# Patient Record
Sex: Female | Born: 1965
Health system: Southern US, Community
[De-identification: ages and names within clinical notes are randomized; demographics above are authoritative.]

## PROBLEM LIST (undated history)

## (undated) DIAGNOSIS — K219 Gastro-esophageal reflux disease without esophagitis: Secondary | ICD-10-CM

## (undated) DIAGNOSIS — E039 Hypothyroidism, unspecified: Secondary | ICD-10-CM

## (undated) DIAGNOSIS — E7800 Pure hypercholesterolemia, unspecified: Secondary | ICD-10-CM

## (undated) DIAGNOSIS — M1990 Unspecified osteoarthritis, unspecified site: Secondary | ICD-10-CM

## (undated) HISTORY — PX: REFRACTIVE SURGERY: SHX103

## (undated) HISTORY — PX: EYE SURGERY: SHX253

## (undated) HISTORY — DX: Unspecified osteoarthritis, unspecified site: M19.90

---

## 2004-08-06 ENCOUNTER — Observation Stay: Payer: Self-pay

## 2004-08-07 ENCOUNTER — Ambulatory Visit: Payer: Self-pay | Admitting: Obstetrics and Gynecology

## 2004-08-26 ENCOUNTER — Observation Stay: Payer: Self-pay | Admitting: Obstetrics and Gynecology

## 2004-08-27 ENCOUNTER — Inpatient Hospital Stay: Payer: Self-pay | Admitting: Obstetrics and Gynecology

## 2005-02-15 ENCOUNTER — Ambulatory Visit: Payer: Self-pay

## 2005-02-28 ENCOUNTER — Emergency Department: Payer: Self-pay | Admitting: Emergency Medicine

## 2005-05-21 ENCOUNTER — Ambulatory Visit (HOSPITAL_COMMUNITY): Admission: RE | Admit: 2005-05-21 | Discharge: 2005-05-21 | Payer: Self-pay | Admitting: *Deleted

## 2005-06-10 ENCOUNTER — Ambulatory Visit: Admission: RE | Admit: 2005-06-10 | Discharge: 2005-06-10 | Payer: Self-pay | Admitting: *Deleted

## 2005-06-28 ENCOUNTER — Observation Stay (HOSPITAL_COMMUNITY): Admission: EM | Admit: 2005-06-28 | Discharge: 2005-06-29 | Payer: Self-pay | Admitting: Emergency Medicine

## 2005-11-30 ENCOUNTER — Encounter: Admission: RE | Admit: 2005-11-30 | Discharge: 2005-11-30 | Payer: Self-pay | Admitting: Cardiology

## 2006-02-08 ENCOUNTER — Ambulatory Visit: Payer: Self-pay | Admitting: Obstetrics and Gynecology

## 2006-08-23 IMAGING — CT CT ANGIO CHEST
2 of 5 series · 19 of 36 positions shown · IV contrast (120 ML OMNI 300)
Comparison: none

HISTORY: Chest pain, elevated d-dimer

[Series 3: pe · axial · 0.70mm/px · z∈[-283,-26]mm · 16 of 457 slices shown]
[im 23/457  lung]
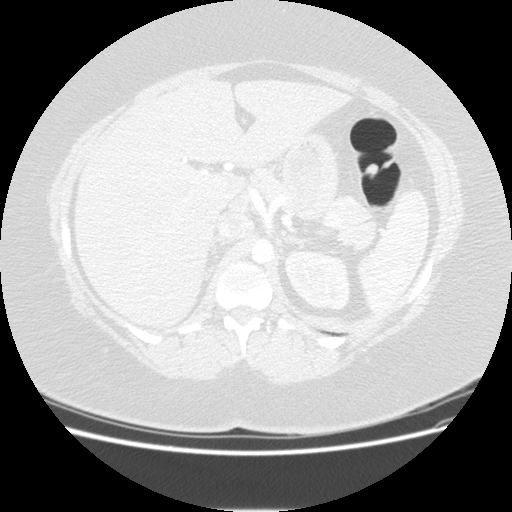
[im 46/457  mediastinal]
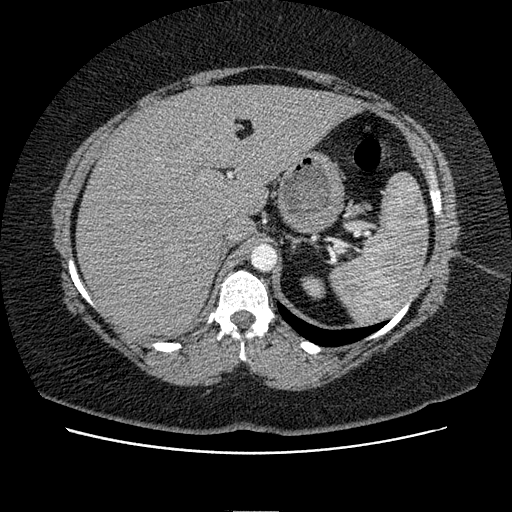
[im 69/457  lung]
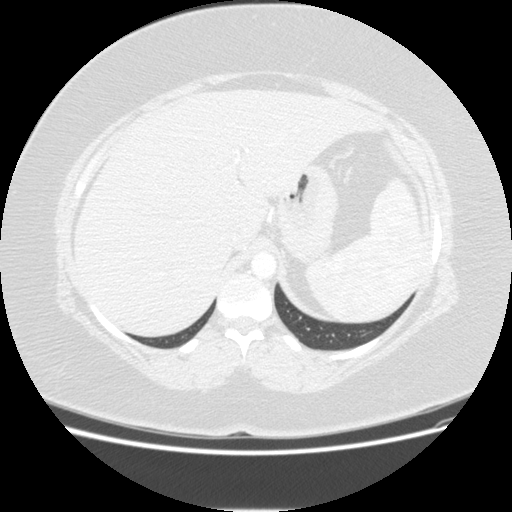
[im 115/457  mediastinal]
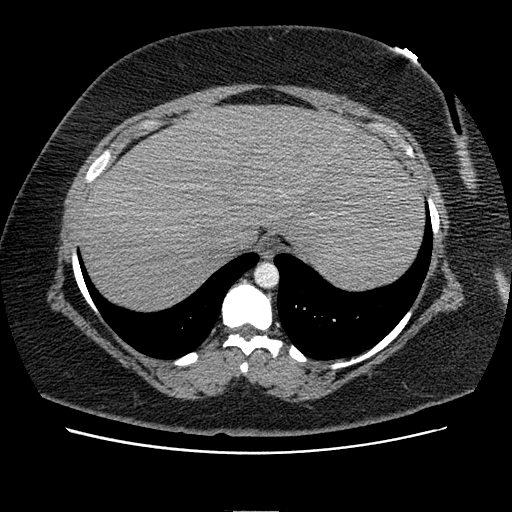
[im 137/457  lung]
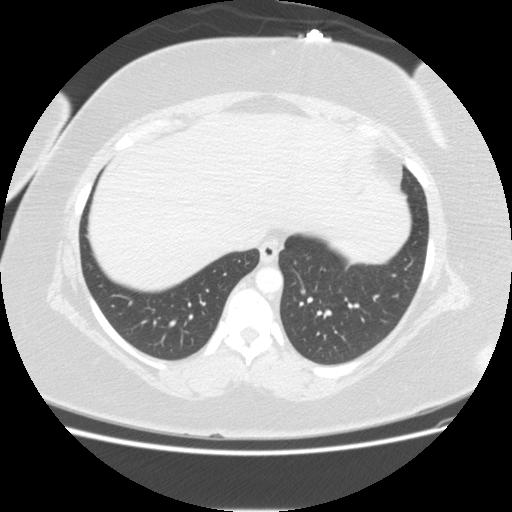
[im 160/457  mediastinal]
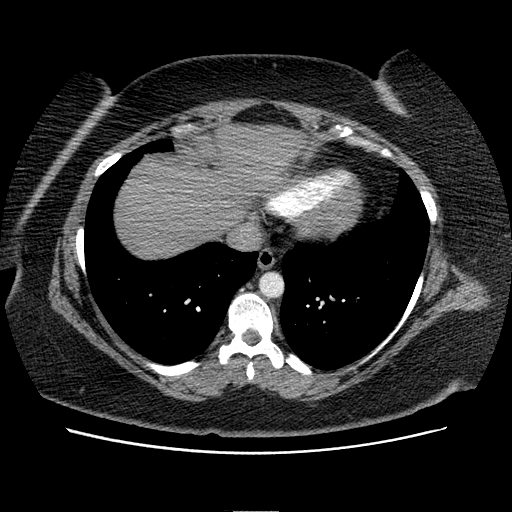
[im 183/457  lung]
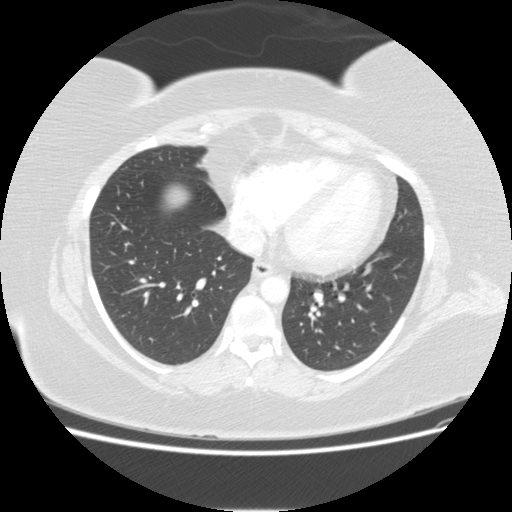
[im 206/457  mediastinal]
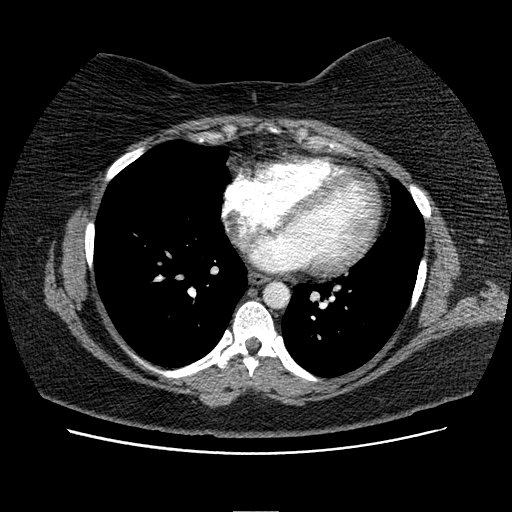
[im 251/457  lung]
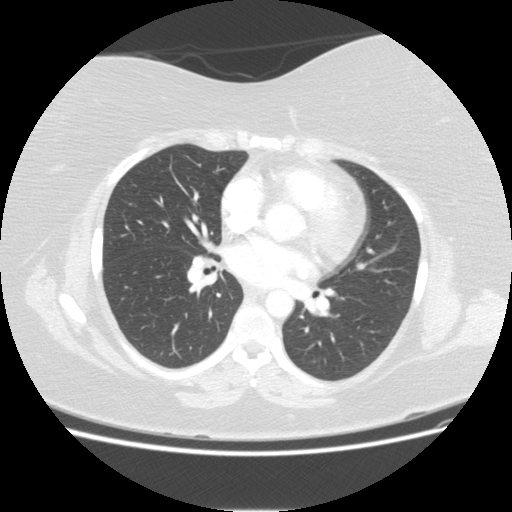
[im 274/457  mediastinal]
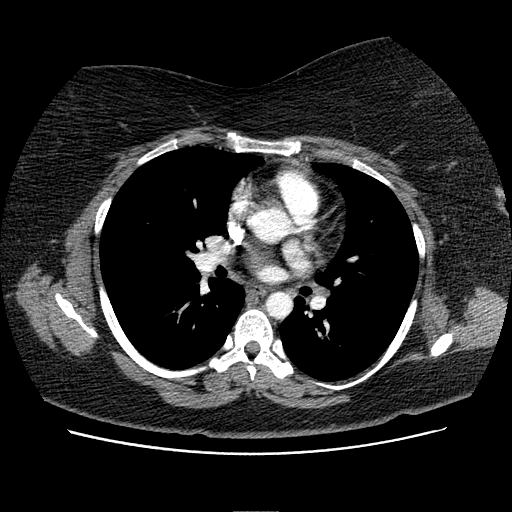
[im 297/457  lung]
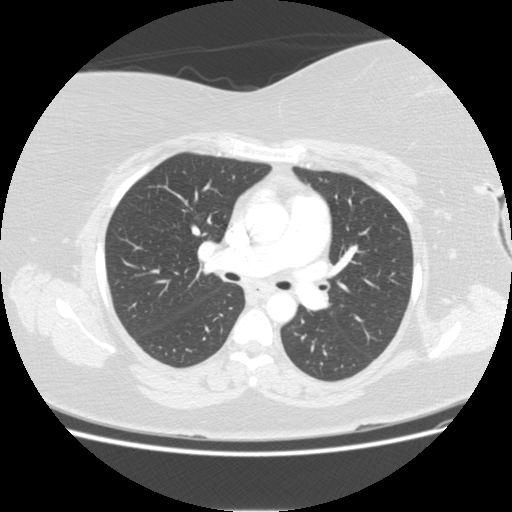
[im 320/457  mediastinal]
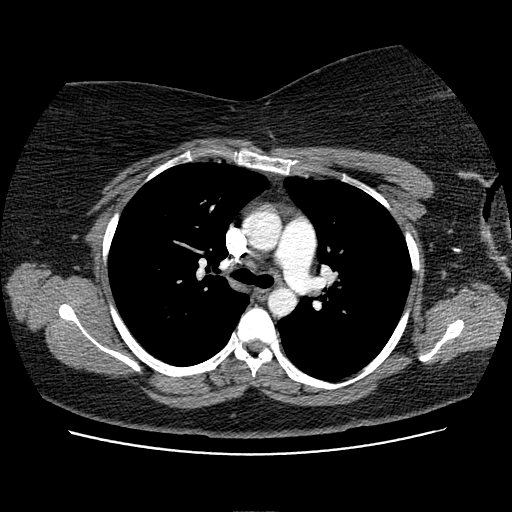
[im 343/457  lung]
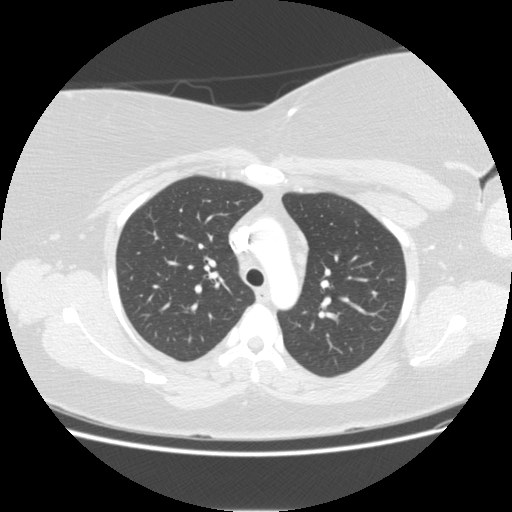
[im 388/457  mediastinal]
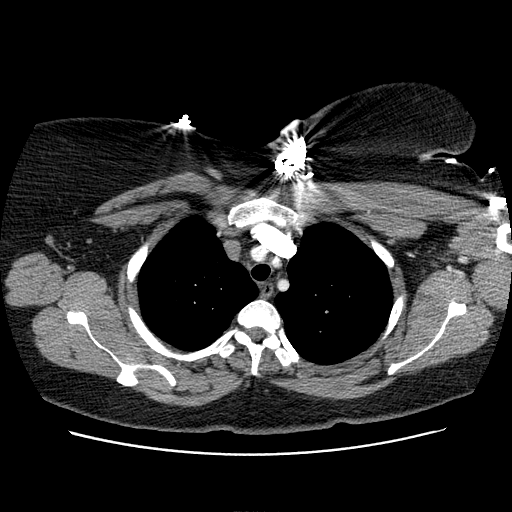
[im 411/457  lung]
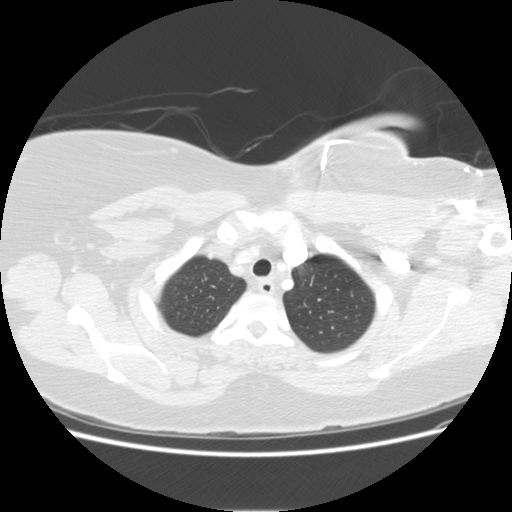
[im 434/457  mediastinal]
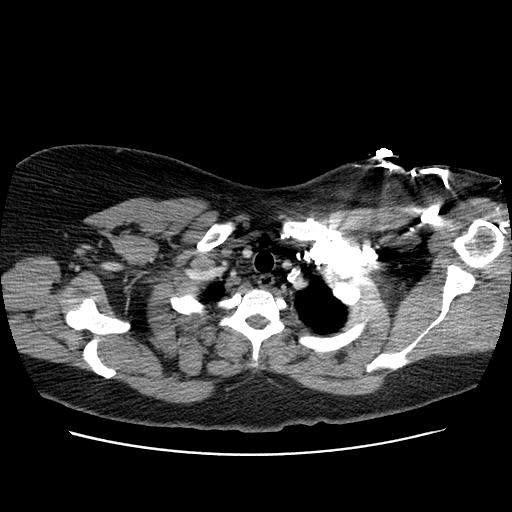

[Series 300: reformatted · coronal · 0.70mm/px · 3 of 117 slices shown]
[im 24/117  mediastinal]
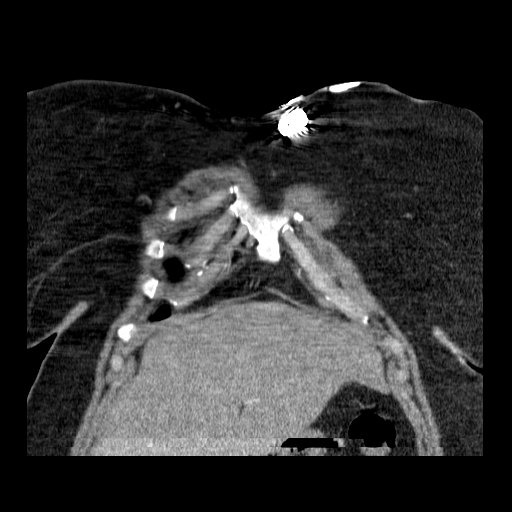
[im 47/117  mediastinal]
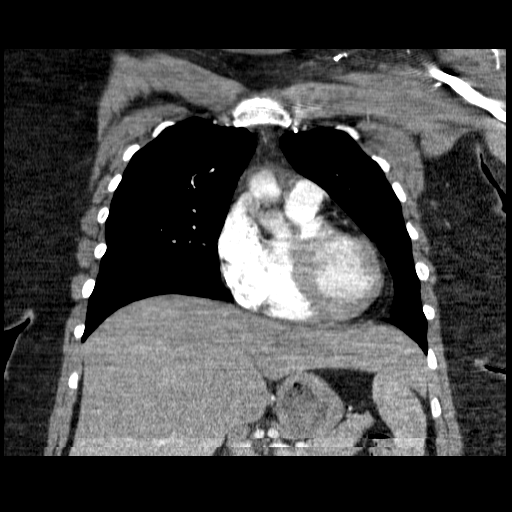
[im 70/117  mediastinal]
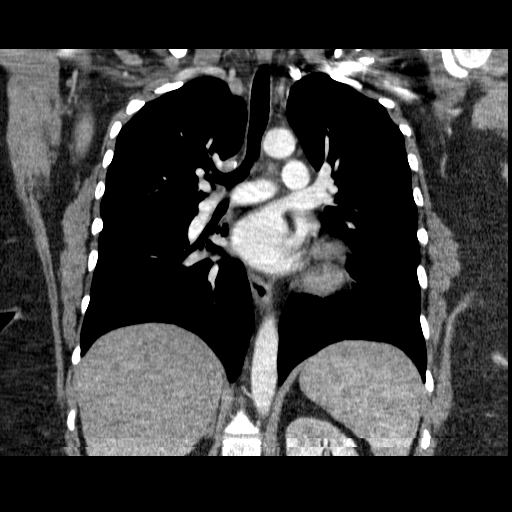

[19 of 36 positions shown; findings below may reference images not displayed]

CT ANGIO CHEST:

Multidetector helical CT imaging chest performed using pulmonary embolism
protocol following 120 cc 6mnipaque-POO. Additional coronal and sagittal images
are reconstructed from the axial data set to assist in evaluation of the
thoracic vasculature.
Comparison 06/10/2005

Aorta normal caliber without aneurysm or dissection.
Pulmonary arteries patent.
No evidence of pulmonary embolism.
Heart size normal. 
No enlarged thoracic lymph nodes.
4 mm diameter right lower lobe lung nodule, image 50, stable.
No pulmonary infiltrate, pleural effusion, or pneumothorax.
Visualized portion of upper abdomen unremarkable.
Electronic device subcutaneously in left anterior chest wall, new.
No bone lesions.
IMPRESSION: No evidence of pulmonary embolism or other acute thoracic process.
Persistent 4 mm diameter right lower lobe nodule.
Followup CT chest recommended in one year to confirm stability.

## 2007-02-22 ENCOUNTER — Ambulatory Visit (HOSPITAL_COMMUNITY): Admission: RE | Admit: 2007-02-22 | Discharge: 2007-02-22 | Payer: Self-pay | Admitting: *Deleted

## 2007-06-22 ENCOUNTER — Ambulatory Visit (HOSPITAL_COMMUNITY): Admission: RE | Admit: 2007-06-22 | Discharge: 2007-06-22 | Payer: Self-pay | Admitting: *Deleted

## 2008-02-28 ENCOUNTER — Ambulatory Visit: Payer: Self-pay | Admitting: Obstetrics and Gynecology

## 2008-04-13 ENCOUNTER — Emergency Department: Payer: Self-pay | Admitting: Emergency Medicine

## 2008-10-02 ENCOUNTER — Emergency Department: Payer: Self-pay | Admitting: Emergency Medicine

## 2009-03-04 ENCOUNTER — Ambulatory Visit: Payer: Self-pay | Admitting: Obstetrics and Gynecology

## 2010-04-16 ENCOUNTER — Ambulatory Visit: Payer: Self-pay | Admitting: Obstetrics and Gynecology

## 2010-09-22 NOTE — Op Note (Signed)
NAME:  Laura Gilmore, Laura Gilmore NO.:  [id number]   MEDICAL RECORD NO.:  [id number]          PATIENT TYPE:  OIB   LOCATION:  2859                         FACILITY:  [facility]   PHYSICIAN:  Darlin Priestly, MD  DATE OF BIRTH:  01-Sep-1965   DATE OF PROCEDURE:  06/22/2007  DATE OF DISCHARGE:  06/22/2007                               OPERATIVE REPORT   PROCEDURE:  Explant of a Medtronic Reveal Plus loop recorder, serial  #[id number].  Initial implant of June 10, 2005.   ATTENDING:  Darlin Priestly, M.D.   COMPLICATIONS:  None.   INDICATIONS:  Laura Gilmore is a 45 year old female with a history of  syncope and presyncope who underwent loop implant on June 10, 2005,  which revealed intermittent episodes of SVT, which have now resolved  with Toprol therapy.  She has recently been found to have a loop which  has now reached the end of life.  She is now brought for loop explant.   DESCRIPTION OF OPERATION:  After giving informed consent, the patient  was brought to the coronary cath lab where her left chest was prepped  and draped in a sterile fashion.  ECG monitor was established.  Lidocaine 1% was used to anesthetized over the previous implanted  generator.  Next, approximately 2 cm horizontal incision was then  carried out over the loop, and hemostasis was obtained with  electrocautery.  Blunt dissection was used to carry this down to the  loop capsule.  The capsule was then opened with a scalpel, and the  generator was then freed from the capsule.  Two anchoring silk sutures  were then removed from the header, and the loop recorder was then  removed from the pocket.  The sutures were then removed from the  pectoral fascia.  The pocket was then copiously irrigated with 1%  kanamycin solution.  Again, hemostasis was confirmed.  The subcutaneous  layers were then closed using 2-0 Vicryl.  The skin was then closed  using 4-0 Vicryl.  Steri-Strips were applied.  The  patient was taken to  the recovery room in stable condition.   CONCLUSION:  Successful implant of a Medtronic Reveal Plus loop  recorder, serial [id number], initially implanted on June 10, 2005.      Darlin Priestly, MD  Electronically Signed     RHM/MEDQ  D:  06/22/2007  T:  06/23/2007  Job:  [id number]

## 2010-09-25 NOTE — Discharge Summary (Signed)
NAME:  Laura Gilmore, FALES NO.:  [id number]   MEDICAL RECORD NO.:  [id number]          PATIENT TYPE:  INP   LOCATION:  3735                         FACILITY:  [facility]   PHYSICIAN:  Darlin Priestly, MD  DATE OF BIRTH:  [birth year]   DATE OF ADMISSION:  06/28/2005  DATE OF DISCHARGE:  06/29/2005                                 DISCHARGE SUMMARY   HOSPITAL COURSE:  Laura Gilmore is a 45 year old female who came to the  emergency room with 5-6 episodes of right-sided lateral chest pain.  Each  episode lasted about 1-2 seconds.  The patient was watching TV and folding  clothes.  She had no shortness of breath, no palpitations, no nausea or  tenderness and she had previously been evaluated for syncope and presyncope;  she had a negative tilt table test, negative Cardiolite.  She had a normal 2-  D echo and most recently underwent loop recorder implantation.  She was seen  by Dr. Lenise Herald on admission.  She was put on some Lovenox and decided  to be admitted for 24-hour observation and rule out for an MI.  She ruled  out for an MI on June 29, 2005 and it was decided she should have her  loop recorder checked; it showed that she had a run of SVT at a rate of 187;  however, she did not have any symptoms at that time.  It was decided that we  would put her on Toprol-XL 25 mg a day and she would see Dr. Jenne Campus in 2  weeks.   LABORATORY DATA:  Sodium 134, potassium 3.7, BUN 10, creatinine 0.8, glucose  98.  CK-MBs and troponins were negative.  D-dimer was 1.2.   CT scan was negative for pulmonary embolus.  She did have a 4-mm right lower  lobe nodule that was recommended to recheck in 1 year.   Hemoglobin A1c was 5.4.  Hemoglobin was 13.9, hematocrit 41.3 and platelets  were 238,000.   DISCHARGE MEDICATIONS:  1.  Aspirin 81 mg a day.  2.  Birth control pills daily.  3.  Toprol-XL 25 mg every day.  4.  Pepcid 20 mg a day.   FOLLOWUP:  She should follow up with Dr.  Jenne Campus on July 15, 2005.   DISCHARGE DIAGNOSES:  1.  Chest pain, not thought coronary ischemia with recent workup all      negative.  2.  Paroxysmal supraventricular tachycardia.  3.  Right lower lobe nodule needs to be rechecked in 1 year by CT scan.  4.  Status post loop recorder implantation secondary to syncope.      Lezlie Octave, N.P.      Darlin Priestly, MD  Electronically Signed    BB/MEDQ  D:  06/29/2005  T:  06/30/2005  Job:  [id number]   cc:   Nicholes Rough [facility]

## 2010-09-25 NOTE — Cardiovascular Report (Signed)
NAME:  Laura Gilmore, Laura Gilmore NO.:  [id number]   MEDICAL RECORD NO.:  [id number]          PATIENT TYPE:  OIB   LOCATION:  2899                         FACILITY:  [facility]   PHYSICIAN:  Darlin Priestly, MD  DATE OF BIRTH:  01-Oct-1965   DATE OF PROCEDURE:  05/21/2005  DATE OF DISCHARGE:                              CARDIAC CATHETERIZATION   HEADS UP TILT TABLE TEST WITH ISOPROTERENOL INFUSION:   INDICATIONS:  [name] is a 45 year old female, patient of Dr. Loma Sender with a history of recurrent syncope and presyncope for the last 2  months.  These episodes have occurred at random including while driving.  She does have a history of migraine headaches but no history of known  seizure disorder.  She is now referred for a heads up tilt table testing to  rule out neurocardiogenic etiology.   DESCRIPTION OF PROCEDURE:  After informed consent the patient was brought to  the cardiac cath lab in a fasting state.  She has been placed in a supine  position hemodynamic measurements obtained.  The patient's blood pressure  109/66, resting heart rate is 66.  The patient was monitored for  approximately 5 minutes and then tilted to 70 degrees head up position.  She  complained of some mild nausea, but had no change in her heart rate or blood  pressure in 30 minutes.  She was then returned to a supine position.  An  isoproterenol infusion was begun at 15 mcg.  After 2-3 minutes she was then  tilted again to 70 degree heads up position and monitored for 10 minutes.  However she again had no significant change in her heart rate or blood  pressure.  She again had some mild nausea and diaphoresis, but no syncope or  presyncope.  The isoproterenol was discontinued.  She was then returned to  supine position.  She was then returned to the recovery room in stable  condition.   CONCLUSIONS:  Negative heads up tilt table testing with isoproterenol  infusion.      Darlin Priestly, MD  Electronically Signed     RHM/MEDQ  D:  05/21/2005  T:  05/22/2005  Job:  [id number]   cc:   Darlin Priestly, MD  Fax: [phone]   Loma Sender  Fax: [phone]

## 2010-09-25 NOTE — Op Note (Signed)
NAME:  Laura Gilmore, GAUGHRAN NO.:  [id number]   MEDICAL RECORD NO.:  [id number]          PATIENT TYPE:  OUT   LOCATION:  CATS                         FACILITY:  [facility]   PHYSICIAN:  Darlin Priestly, MD  DATE OF BIRTH:  11-Oct-1965   DATE OF PROCEDURE:  06/10/2005  DATE OF DISCHARGE:                                 OPERATIVE REPORT   PROCEDURE:  Insertion of a Medtronic Reveal Plus loop recorder, serial no.  [id number].   COMPLICATIONS:  None.   INDICATIONS:  Ms. Chapdelaine is a 45 year old female, a patient of Dr.  Vear Clock, with a history recurrent syncope over the last 6-8 months.  These  episodes have lasted anywhere from several minutes up to an hour and a half  of unconsciousness.  She has undergone a 2D echocardiogram revealing no  significant structural disease, as well as a stress test revealing no  significant ischemia.  She has also had a tilt table which was essentially  negative.  She has also seen ENT and a neurologist, with negative workup.  She is now referred for loop recorder implant to rule out possible  arrhythmic etiology.   DESCRIPTION OF OPERATION:  After obtaining informed consent, the patient was  brought to the cardiac catheterization lab, where the left chest was prepped  and draped in the usual sterile fashion.  ECG monitoring established.  One  percent lidocaine was used to anesthetize approximately 2 cm to the left of  the midsternal line.  Next, an approximately 2 cm horizontal incision was  then carried out, and blunt dissection was used to carry this down to the  left pectoral fascia.  Hemostasis was obtained with electrocautery.  Next,  an approximately 2 x 3 cm pocket was created over the left pectoral fascia.  Hemostasis was again confirmed.  Two silk sutures were then placed at the  apex of the pocket.  The pocket was then copious irrigated with 1% kanamycin  solution.  Again, hemostasis was confirmed.  Following this, a Medtronic  Reveal Plus loop recorder, model no. 9526, serial no. [id number] was then  inserted into the pocket.  Two silk sutures were then tied to the header,  and the loop recorder secured.  The subcutaneous layer was then closed using  a running 2-0 Vicryl.  The skin was then closed using a running 4-0 Vicryl.  Steri-Strips were then applied.  The patient returned to the recovery room  in stable condition.   CONCLUSIONS:  Successful implant of a Medtronic Reveal Plus loop recorder,  model no. 9526, serial no. [id number].      Darlin Priestly, MD  Electronically Signed     RHM/MEDQ  D:  06/10/2005  T:  06/10/2005  Job:  [id number]   cc:   Janetta Hora. Fields, MD  [address], [place] [address]

## 2011-06-15 ENCOUNTER — Ambulatory Visit: Payer: Self-pay | Admitting: Obstetrics and Gynecology

## 2012-06-16 ENCOUNTER — Ambulatory Visit: Payer: Self-pay | Admitting: Obstetrics and Gynecology

## 2013-06-19 ENCOUNTER — Ambulatory Visit: Payer: Self-pay | Admitting: Obstetrics and Gynecology

## 2014-06-20 ENCOUNTER — Ambulatory Visit: Payer: Self-pay | Admitting: Obstetrics and Gynecology

## 2015-03-12 ENCOUNTER — Ambulatory Visit: Payer: Self-pay | Admitting: Physician Assistant

## 2015-03-12 ENCOUNTER — Encounter: Payer: Self-pay | Admitting: Physician Assistant

## 2015-03-12 VITALS — BP 130/80 | HR 87 | Temp 99.3°F

## 2015-03-12 DIAGNOSIS — J069 Acute upper respiratory infection, unspecified: Secondary | ICD-10-CM

## 2015-03-12 MED ORDER — FLUTICASONE PROPIONATE 50 MCG/ACT NA SUSP: 2.0000 | Freq: Every day | NASAL | Status: DC

## 2015-03-12 MED ORDER — FLUCONAZOLE 150 MG PO TABS: 150.0000 mg | ORAL_TABLET | Freq: Once | ORAL | Status: DC

## 2015-03-12 MED ORDER — AMOXICILLIN 875 MG PO TABS: 875.0000 mg | ORAL_TABLET | Freq: Two times a day (BID) | ORAL | Status: DC

## 2015-03-12 NOTE — Progress Notes (Signed)
S: C/o runny nose and congestion for 2 days, + fever,  No chills, cp/sob, v/d; mucus was green this am but clear throughout the day, cough is sporadic, can't sleep bc of head congestion  Using otc meds: advil cold and sinus  O: PE: vitals w low grade temp, nad perrl eomi, normocephalic, tms dull, nasal mucosa red and swollen, throat injected, neck supple no lymph, lungs c t a, cv rrr, neuro intact, cough is dry  A:  Acute uri   P: amoxil 875mg  bid x 10d, flonase, diflucan 150mg  , otc delsym for cough;  drink fluids, continue regular meds , use otc meds of choice, return if not improving in 5 days, return earlier if worsening

## 2015-04-09 ENCOUNTER — Ambulatory Visit: Payer: Self-pay | Admitting: Physician Assistant

## 2015-04-09 ENCOUNTER — Encounter: Payer: Self-pay | Admitting: Physician Assistant

## 2015-04-09 VITALS — BP 110/79 | HR 86 | Temp 98.7°F

## 2015-04-09 DIAGNOSIS — J209 Acute bronchitis, unspecified: Secondary | ICD-10-CM

## 2015-04-09 DIAGNOSIS — E785 Hyperlipidemia, unspecified: Secondary | ICD-10-CM | POA: Insufficient documentation

## 2015-04-09 DIAGNOSIS — J018 Other acute sinusitis: Secondary | ICD-10-CM

## 2015-04-09 MED ORDER — PREDNISONE 10 MG PO TABS: 30.0000 mg | ORAL_TABLET | Freq: Every day | ORAL | Status: DC

## 2015-04-09 MED ORDER — FLUCONAZOLE 150 MG PO TABS: 150.0000 mg | ORAL_TABLET | Freq: Once | ORAL | Status: DC

## 2015-04-09 MED ORDER — HYDROCOD POLST-CPM POLST ER 10-8 MG/5ML PO SUER: 5.0000 mL | Freq: Two times a day (BID) | ORAL | Status: DC | PRN

## 2015-04-09 MED ORDER — CEFDINIR 300 MG PO CAPS: 300.0000 mg | ORAL_CAPSULE | Freq: Two times a day (BID) | ORAL | Status: DC

## 2015-04-09 NOTE — Progress Notes (Signed)
S: recheck of cough and congestion, coughing at night so much she is having to sleep in the recliner, cough is keeping her awake at night, denies cp/sob, now has a lot of ear pressure and sinus drainage, finished amoxil and felt a little better but now is getting worse  O: vitals wnl, nad, tms dull b/l, nasal mucosa red and swollen, throat wnl neck supple no lymph, lungs c t a, cv rrr, cough is dry and hacking  A: acute sinusitis, acute bronchitis  P: omnicef , prednisone 30mg  , diflucan if needed, tussionex 150ml nr,

## 2015-06-02 ENCOUNTER — Encounter: Payer: Self-pay | Admitting: Physician Assistant

## 2015-06-02 ENCOUNTER — Ambulatory Visit: Payer: Self-pay | Admitting: Physician Assistant

## 2015-06-02 VITALS — BP 120/80 | HR 89 | Temp 98.2°F

## 2015-06-02 DIAGNOSIS — R197 Diarrhea, unspecified: Secondary | ICD-10-CM

## 2015-06-02 DIAGNOSIS — R52 Pain, unspecified: Secondary | ICD-10-CM

## 2015-06-02 DIAGNOSIS — R05 Cough: Secondary | ICD-10-CM

## 2015-06-02 DIAGNOSIS — R059 Cough, unspecified: Secondary | ICD-10-CM

## 2015-06-02 DIAGNOSIS — R110 Nausea: Secondary | ICD-10-CM

## 2015-06-02 LAB — POCT INFLUENZA A/B
INFLUENZA A, POC: NEGATIVE
INFLUENZA B, POC: NEGATIVE

## 2015-06-02 MED ORDER — ALBUTEROL SULFATE HFA 108 (90 BASE) MCG/ACT IN AERS
INHALATION_SPRAY | RESPIRATORY_TRACT | Status: DC
Start: 2015-06-02 — End: 2015-07-17

## 2015-06-02 MED ORDER — PROMETHAZINE HCL 25 MG PO TABS: 25.0000 mg | ORAL_TABLET | Freq: Three times a day (TID) | ORAL | Status: DC | PRN

## 2015-06-02 NOTE — Progress Notes (Signed)
S: c/o nausea and diarrhea, still has cough and congestion but has white mucus, no known fever but started with bodyaches yesterday, no cp/sob/abd pain; 2 episodes of diarrhea today, no vomiting, did get flu vaccine  O: vitals wnl, nad, ENT wnl, neck supple no lymph, lungs c t a, cv rrr, abd soft nontender bs normal all 4 quads, rapid flu test neg  A: viral gastroenteritis  P: phenergan, reassurance about cough, no work until has not had diarrhea for 24 hours

## 2015-06-02 NOTE — Patient Instructions (Signed)
Diarrhea Diarrhea is watery poop (stool). It can make you feel weak, tired, thirsty, or give you a dry mouth (signs of dehydration). Watery poop is a sign of another problem, most often an infection. It often lasts 2-3 days. It can last longer if it is a sign of something serious. Take care of yourself as told by your doctor. HOME CARE   Drink 1 cup (8 ounces) of fluid each time you have watery poop.  Do not drink the following fluids:  Those that contain simple sugars (fructose, glucose, galactose, lactose, sucrose, maltose).  Sports drinks.  Fruit juices.  Whole milk products.  Sodas.  Drinks with caffeine (coffee, tea, soda) or alcohol.  Oral rehydration solution may be used if the doctor says it is okay. You may make your own solution. Follow this recipe:   - teaspoon table salt.   teaspoon baking soda.   teaspoon salt substitute containing potassium chloride.  1 tablespoons sugar.  1 liter (34 ounces) of water.  Avoid the following foods:  High fiber foods, such as raw fruits and vegetables.  Nuts, seeds, and whole grain breads and cereals.   Those that are sweetened with sugar alcohols (xylitol, sorbitol, mannitol).  Try eating the following foods:  Starchy foods, such as rice, toast, pasta, low-sugar cereal, oatmeal, baked potatoes, crackers, and bagels.  Bananas.  Applesauce.  Eat probiotic-rich foods, such as yogurt and milk products that are fermented.  Wash your hands well after each time you have watery poop.  Only take medicine as told by your doctor.  Take a warm bath to help lessen burning or pain from having watery poop. GET HELP RIGHT AWAY IF:   You cannot drink fluids without throwing up (vomiting).  You keep throwing up.  You have blood in your poop, or your poop looks black and tarry.  You do not pee (urinate) in 6-8 hours, or there is only a small amount of very dark pee.  You have belly (abdominal) pain that gets worse or stays  in the same spot (localizes).  You are weak, dizzy, confused, or light-headed.  You have a very bad headache.  Your watery poop gets worse or does not get better.  You have a fever or lasting symptoms for more than 2-3 days.  You have a fever and your symptoms suddenly get worse. MAKE SURE YOU:   Understand these instructions.  Will watch your condition.  Will get help right away if you are not doing well or get worse.   This information is not intended to replace advice given to you by your health care provider. Make sure you discuss any questions you have with your health care provider.   Document Released: 10/13/2007 Document Revised: 05/17/2014 Document Reviewed: 01/02/2012 Elsevier Interactive Patient Education 2016 [employer] Upper Respiratory Infection, Adult Most upper respiratory infections (URIs) are caused by a virus. A URI affects the nose, throat, and upper air passages. The most common type of URI is often called "the common cold." HOME CARE   Take medicines only as told by your doctor.  Gargle warm saltwater or take cough drops to comfort your throat as told by your doctor.  Use a warm mist humidifier or inhale steam from a shower to increase air moisture. This may make it easier to breathe.  Drink enough fluid to keep your pee (urine) clear or pale yellow.  Eat soups and other clear broths.  Have a healthy diet.  Rest as needed.  Go back  to work when your fever is gone or your doctor says it is okay.  You may need to stay home longer to avoid giving your URI to others.  You can also wear a face mask and wash your hands often to prevent spread of the virus.  Use your inhaler more if you have asthma.  Do not use any tobacco products, including cigarettes, chewing tobacco, or electronic cigarettes. If you need help quitting, ask your doctor. GET HELP IF:  You are getting worse, not better.  Your symptoms are not helped by medicine.  You have  chills.  You are getting more short of breath.  You have brown or red mucus.  You have yellow or brown discharge from your nose.  You have pain in your face, especially when you bend forward.  You have a fever.  You have puffy (swollen) neck glands.  You have pain while swallowing.  You have white areas in the back of your throat. GET HELP RIGHT AWAY IF:   You have very bad or constant:  Headache.  Ear pain.  Pain in your forehead, behind your eyes, and over your cheekbones (sinus pain).  Chest pain.  You have long-lasting (chronic) lung disease and any of the following:  Wheezing.  Long-lasting cough.  Coughing up blood.  A change in your usual mucus.  You have a stiff neck.  You have changes in your:  Vision.  Hearing.  Thinking.  Mood. MAKE SURE YOU:   Understand these instructions.  Will watch your condition.  Will get help right away if you are not doing well or get worse.   This information is not intended to replace advice given to you by your health care provider. Make sure you discuss any questions you have with your health care provider.   Document Released: 10/13/2007 Document Revised: 09/10/2014 Document Reviewed: 08/01/2013 Elsevier Interactive Patient Education [employer]

## 2015-06-20 ENCOUNTER — Other Ambulatory Visit: Payer: Self-pay | Admitting: Obstetrics and Gynecology

## 2015-06-20 DIAGNOSIS — Z1231 Encounter for screening mammogram for malignant neoplasm of breast: Secondary | ICD-10-CM

## 2015-06-30 ENCOUNTER — Ambulatory Visit
Admission: RE | Admit: 2015-06-30 | Discharge: 2015-06-30 | Disposition: A | Discharge status: Home / Self Care | Payer: Managed Care, Other (non HMO) | Source: Ambulatory Visit | Attending: Obstetrics and Gynecology | Admitting: Obstetrics and Gynecology

## 2015-06-30 DIAGNOSIS — Z1231 Encounter for screening mammogram for malignant neoplasm of breast: Secondary | ICD-10-CM | POA: Diagnosis present

## 2015-07-17 ENCOUNTER — Encounter: Payer: Self-pay | Admitting: Physician Assistant

## 2015-07-17 ENCOUNTER — Ambulatory Visit: Payer: Self-pay | Admitting: Physician Assistant

## 2015-07-17 VITALS — BP 120/80 | HR 84 | Temp 99.1°F

## 2015-07-17 DIAGNOSIS — J069 Acute upper respiratory infection, unspecified: Secondary | ICD-10-CM

## 2015-07-17 DIAGNOSIS — R059 Cough, unspecified: Secondary | ICD-10-CM

## 2015-07-17 DIAGNOSIS — R05 Cough: Secondary | ICD-10-CM

## 2015-07-17 LAB — POCT INFLUENZA A/B
INFLUENZA A, POC: NEGATIVE
INFLUENZA B, POC: NEGATIVE

## 2015-07-17 MED ORDER — ALBUTEROL SULFATE HFA 108 (90 BASE) MCG/ACT IN AERS: INHALATION_SPRAY | RESPIRATORY_TRACT | Status: DC

## 2015-07-17 MED ORDER — CEFDINIR 300 MG PO CAPS: 300.0000 mg | ORAL_CAPSULE | Freq: Two times a day (BID) | ORAL | Status: DC

## 2015-07-17 NOTE — Progress Notes (Signed)
S: C/o runny nose and congestion for 3 days, no fever, chills, cp/sob, v/d; mucus was yellow , cough is sporadic, out of albuterol that she was given for last uri  O: PE: vitals w elevated temp, perrl eomi, normocephalic, tms dull, nasal mucosa red and swollen, throat injected, neck supple no lymph, lungs c t a, cv rrr, neuro intact, flu swab  A:  Acute uri   P: drink fluids, continue regular meds , use otc meds of choice, return if not improving in 5 days, return earlier if worsening

## 2015-10-08 ENCOUNTER — Other Ambulatory Visit: Payer: Self-pay | Admitting: Unknown Physician Specialty

## 2015-10-08 DIAGNOSIS — M25562 Pain in left knee: Secondary | ICD-10-CM

## 2015-10-22 ENCOUNTER — Ambulatory Visit: Payer: 59

## 2016-07-12 DIAGNOSIS — Z0100 Encounter for examination of eyes and vision without abnormal findings: Secondary | ICD-10-CM | POA: Diagnosis not present

## 2016-07-27 ENCOUNTER — Other Ambulatory Visit: Payer: Self-pay | Admitting: Obstetrics and Gynecology

## 2016-07-27 DIAGNOSIS — N921 Excessive and frequent menstruation with irregular cycle: Secondary | ICD-10-CM | POA: Diagnosis not present

## 2016-07-27 DIAGNOSIS — Z1231 Encounter for screening mammogram for malignant neoplasm of breast: Secondary | ICD-10-CM | POA: Diagnosis not present

## 2016-07-27 DIAGNOSIS — Z01411 Encounter for gynecological examination (general) (routine) with abnormal findings: Secondary | ICD-10-CM | POA: Diagnosis not present

## 2016-08-26 ENCOUNTER — Ambulatory Visit
Admission: RE | Admit: 2016-08-26 | Discharge: 2016-08-26 | Disposition: A | Discharge status: Home / Self Care | Payer: 59 | Source: Ambulatory Visit | Attending: Obstetrics and Gynecology | Admitting: Obstetrics and Gynecology

## 2016-08-26 DIAGNOSIS — Z1231 Encounter for screening mammogram for malignant neoplasm of breast: Secondary | ICD-10-CM | POA: Insufficient documentation

## 2017-02-03 DIAGNOSIS — R42 Dizziness and giddiness: Secondary | ICD-10-CM | POA: Diagnosis not present

## 2017-02-04 DIAGNOSIS — H8112 Benign paroxysmal vertigo, left ear: Secondary | ICD-10-CM | POA: Diagnosis not present

## 2017-02-04 DIAGNOSIS — R42 Dizziness and giddiness: Secondary | ICD-10-CM | POA: Diagnosis not present

## 2017-02-08 DIAGNOSIS — H8112 Benign paroxysmal vertigo, left ear: Secondary | ICD-10-CM | POA: Diagnosis not present

## 2017-02-08 DIAGNOSIS — R42 Dizziness and giddiness: Secondary | ICD-10-CM | POA: Diagnosis not present

## 2017-03-09 DIAGNOSIS — R197 Diarrhea, unspecified: Secondary | ICD-10-CM | POA: Diagnosis not present

## 2017-03-09 DIAGNOSIS — R1084 Generalized abdominal pain: Secondary | ICD-10-CM | POA: Diagnosis not present

## 2017-03-09 DIAGNOSIS — R110 Nausea: Secondary | ICD-10-CM | POA: Diagnosis not present

## 2017-03-14 DIAGNOSIS — R197 Diarrhea, unspecified: Secondary | ICD-10-CM | POA: Diagnosis not present

## 2017-03-14 DIAGNOSIS — R1013 Epigastric pain: Secondary | ICD-10-CM | POA: Diagnosis not present

## 2017-03-15 DIAGNOSIS — R197 Diarrhea, unspecified: Secondary | ICD-10-CM | POA: Diagnosis not present

## 2017-07-26 ENCOUNTER — Emergency Department
Admission: EM | Admit: 2017-07-26 | Discharge: 2017-07-26 | Disposition: A | Discharge status: Home / Self Care | Payer: 59 | Attending: Emergency Medicine | Admitting: Emergency Medicine

## 2017-07-26 ENCOUNTER — Other Ambulatory Visit: Payer: Self-pay

## 2017-07-26 ENCOUNTER — Emergency Department: Payer: 59

## 2017-07-26 ENCOUNTER — Encounter: Payer: Self-pay | Admitting: Emergency Medicine

## 2017-07-26 DIAGNOSIS — M545 Low back pain, unspecified: Secondary | ICD-10-CM

## 2017-07-26 DIAGNOSIS — Z79899 Other long term (current) drug therapy: Secondary | ICD-10-CM | POA: Diagnosis not present

## 2017-07-26 DIAGNOSIS — Z7982 Long term (current) use of aspirin: Secondary | ICD-10-CM | POA: Diagnosis not present

## 2017-07-26 DIAGNOSIS — M25511 Pain in right shoulder: Secondary | ICD-10-CM | POA: Insufficient documentation

## 2017-07-26 DIAGNOSIS — R791 Abnormal coagulation profile: Secondary | ICD-10-CM | POA: Insufficient documentation

## 2017-07-26 LAB — BASIC METABOLIC PANEL
ANION GAP: 11 (ref 5–15)
BUN: 9 mg/dL (ref 6–20)
CALCIUM: 9.3 mg/dL (ref 8.9–10.3)
CO2: 22 mmol/L (ref 22–32)
CREATININE: 0.8 mg/dL (ref 0.44–1.00)
Chloride: 103 mmol/L (ref 101–111)
GFR calc Af Amer: >60 mL/min (ref >60)
GLUCOSE: 92 mg/dL (ref 65–99)
Potassium: 4.6 mmol/L (ref 3.5–5.1)
Sodium: 136 mmol/L (ref 135–145)

## 2017-07-26 LAB — CBC
HCT: 44.4 % (ref 35.0–47.0)
HEMOGLOBIN: 14.8 g/dL (ref 12.0–16.0)
MCH: 29.6 pg (ref 26.0–34.0)
MCHC: 33.4 g/dL (ref 32.0–36.0)
MCV: 88.5 fL (ref 80.0–100.0)
PLATELETS: 287 10*3/uL (ref 150–440)
RBC: 5.02 MIL/uL (ref 3.80–5.20)
RDW: 13.6 % (ref 11.5–14.5)
WBC: 7.5 10*3/uL (ref 3.6–11.0)

## 2017-07-26 LAB — HEPATIC FUNCTION PANEL
ALT: 12 U/L — ABNORMAL LOW (ref 14–54)
AST: 24 U/L (ref 15–41)
Albumin: 3.4 g/dL — ABNORMAL LOW (ref 3.5–5.0)
Alkaline Phosphatase: 77 U/L (ref 38–126)
Total Bilirubin: 0.4 mg/dL (ref 0.3–1.2)
Total Protein: 8.1 g/dL (ref 6.5–8.1)

## 2017-07-26 LAB — TROPONIN I

## 2017-07-26 LAB — LIPASE, BLOOD: LIPASE: 26 U/L (ref 11–51)

## 2017-07-26 LAB — PROTIME-INR
INR: 0.99
PROTHROMBIN TIME: 13 s (ref 11.4–15.2)

## 2017-07-26 LAB — FIBRIN DERIVATIVES D-DIMER (ARMC ONLY): FIBRIN DERIVATIVES D-DIMER (ARMC): 1467.24 ng{FEU}/mL — AB (ref 0.00–499.00)

## 2017-07-26 LAB — APTT: aPTT: 30 seconds (ref 24–36)

## 2017-07-26 MED ORDER — PREDNISONE 20 MG PO TABS: 60.0000 mg | ORAL_TABLET | Freq: Every day | ORAL | 0 refills | Status: DC

## 2017-07-26 MED ORDER — KETOROLAC TROMETHAMINE 60 MG/2ML IM SOLN
60.0000 mg | Freq: Once | INTRAMUSCULAR | Status: AC
  Administered 2017-07-26: 60 mg via INTRAMUSCULAR
  Filled 2017-07-26: qty 2

## 2017-07-26 MED ORDER — PREDNISONE 20 MG PO TABS
60.0000 mg | ORAL_TABLET | Freq: Once | ORAL | Status: AC
  Administered 2017-07-26: 60 mg via ORAL

## 2017-07-26 MED ORDER — ACETAMINOPHEN 500 MG PO TABS
1000.0000 mg | ORAL_TABLET | Freq: Once | ORAL | Status: AC
  Administered 2017-07-26: 1000 mg via ORAL
  Filled 2017-07-26: qty 2

## 2017-07-26 MED ORDER — IOPAMIDOL (ISOVUE-370) INJECTION 76%
75.0000 mL | Freq: Once | INTRAVENOUS | Status: AC | PRN
  Administered 2017-07-26: 75 mL via INTRAVENOUS
  Filled 2017-07-26: qty 75

## 2017-07-26 MED ORDER — PREDNISONE 20 MG PO TABS
ORAL_TABLET | ORAL | Status: AC
  Filled 2017-07-26: qty 3

## 2017-07-26 NOTE — ED Notes (Signed)
Attempted blood tube collection using vein finder with no success. Lab to send someone to draw specimens.

## 2017-07-26 NOTE — Discharge Instructions (Signed)
Please return to the emergency department if you develop severe pain, shortness of breath, fever, or any other symptoms concerning to you.

## 2017-07-26 NOTE — ED Triage Notes (Signed)
Pt reports that she has had right shoulder pain (for the last week) that she went to see her PMD for they gave her a muscle relaxer and pain pill that has not touched it, reports that she is having chest tightness from the radiating pain on the right side now. Denies SOB, diaphoresis, or nausea.

## 2017-07-26 NOTE — ED Provider Notes (Signed)
[facility] Emergency Department Provider Note  ____________________________________________  Time seen: Approximately 5:09 PM  I have reviewed the triage vital signs and the nursing notes.   HISTORY  Chief Complaint Shoulder Pain and Chest Pain    HPI Laura MessingMichelle B Gilmore is a 52 y.o. female with a history of obesity and hyperlipidemia presenting with right sided back pain.  The patient reports that 7 days ago she was sitting at work when she noticed "sharp stabbing" pain just below the right scapula.  This pain has been constant since then and is worse with walking.  She has tried a heating pad, and hydrocodone as well as a muscle relaxer that she was prescribed by urgent care, without any improvement.  She has not had any associated chest pain, shortness of breath, nausea vomiting or diarrhea.  She has no epigastric or right upper quadrant pain.  She has not had any known trauma or strain.  The pain is occasionally worse with deep breaths but not always.  No lower extremity swelling or calf pain.  History reviewed. No pertinent past medical history.  Patient Active Problem List   Diagnosis Date Noted  . HLD (hyperlipidemia) 04/09/2015    History reviewed. No pertinent surgical history.  Current Outpatient Rx  . Order #: [id number] Class: Normal  . Order #: [id number] Class: Historical Med  . Order #: [id number] Class: Historical Med  . Order #: [id number] Class: Normal  . Order #: [id number] Class: Historical Med  . Order #: [id number] Class: Print  . Order #: [id number] Class: Normal    Allergies Patient has no known allergies.  Family History  Problem Relation Age of Onset  . Breast cancer Neg Hx     Social History Social History   Tobacco Use  . Smoking status: Never Smoker  Substance Use Topics  . Alcohol use: Not on file  . Drug use: Not on file    Review of Systems Constitutional: No fever/chills.  Lightheadedness or syncope.  No trauma. Eyes: No  visual changes. ENT: No sore throat. No congestion or rhinorrhea. Cardiovascular: Denies chest pain. Denies palpitations. Respiratory: Denies shortness of breath.  No cough. Gastrointestinal: No abdominal pain.  No nausea, no vomiting.  No diarrhea.  No constipation. Genitourinary: Negative for dysuria. Musculoskeletal: Positive for sharp pain below the right scapula. Skin: Negative for rash. Neurological: Negative for headaches. No focal numbness, tingling or weakness.     ____________________________________________   PHYSICAL EXAM:  VITAL SIGNS: ED Triage Vitals  Enc Vitals Group     BP 07/26/17 1311 127/71     Pulse Rate 07/26/17 1311 71     Resp 07/26/17 1311 18     Temp 07/26/17 1311 98.5 F (36.9 C)     Temp Source 07/26/17 1311 Oral     SpO2 07/26/17 1311 100 %     Weight 07/26/17 1307 235 lb (106.6 kg)     Height 07/26/17 1307 5\' 1"  (1.549 m)     Head Circumference --      Peak Flow --      Pain Score 07/26/17 1307 9     Pain Loc --      Pain Edu? --      Excl. in GC? --     Constitutional: Alert and oriented. Well appearing and in no acute distress. Answers questions appropriately.  The patient is able to move around without any difficulty. Eyes: Conjunctivae are normal.  EOMI. No scleral icterus. Head: Atraumatic. Nose: No congestion/rhinnorhea. Mouth/Throat: Mucous membranes are moist.  Neck: No stridor.  Supple.  No JVD.  No meningismus. Cardiovascular: Normal rate, regular rhythm. No murmurs, rubs or gallops.  Respiratory: Normal respiratory effort.  No accessory muscle use or retractions. Lungs CTAB.  No wheezes, rales or ronchi. Gastrointestinal: Obese.  Soft, nontender and nondistended.  Negative Murphy sign.  No guarding or rebound.  No peritoneal signs. Musculoskeletal: Full range of motion of the bilateral shoulders, elbows and wrists.  No midline CT or L-spine tenderness to palpation.  No tenderness that is reproducible over the scapula.  No  reproducible tenderness below the scapula with palpation although the patient does feel the pain if she walks.  No LE edema. No ttp in the calves or palpable cords.  Negative Homan's sign. Neurologic:  A&Ox3.  Speech is clear.  Face and smile are symmetric.  EOMI.  Moves all extremities well. Skin:  Skin is warm, dry and intact. No rash noted.  Notably the area of pain is examined and there is no evidence of rash. Psychiatric: Mood and affect are normal. Speech and behavior are normal.  Normal judgement.  ____________________________________________   LABS (all labs ordered are listed, but only abnormal results are displayed)  Labs Reviewed  HEPATIC FUNCTION PANEL - Abnormal; Notable for the following components:      Result Value   Albumin 3.4 (*)    ALT 12 (*)    Bilirubin, Direct <0.1 (*)    All other components within normal limits  FIBRIN DERIVATIVES D-DIMER ([facility] ONLY) - Abnormal; Notable for the following components:   Fibrin derivatives D-dimer [facility]) 1,610.96 (*)    All other components within normal limits  BASIC METABOLIC PANEL  CBC  TROPONIN I  TROPONIN I  LIPASE, BLOOD  PROTIME-INR  APTT  POC URINE PREG, ED   ____________________________________________  EKG  ED ECG REPORT I, Rockne Menghini, the attending physician, personally viewed and interpreted this ECG.   Date: 07/26/2017  EKG Time: 1307  Rate: 80  Rhythm: normal sinus rhythm  Axis: normal  Intervals:none  ST&T Change: No STEMI  ____________________________________________  RADIOLOGY  Ct Angio Chest Pe W And/or Wo Contrast  Result Date: 07/26/2017 CLINICAL DATA:  RIGHT shoulder pain, elevated D-dimer EXAM: CT ANGIOGRAPHY CHEST WITH CONTRAST TECHNIQUE: Multidetector CT imaging of the chest was performed using the standard protocol during bolus administration of intravenous contrast. Multiplanar CT image reconstructions and MIPs were obtained to evaluate the vascular anatomy. CONTRAST:  75mL  ISOVUE-370 IOPAMIDOL (ISOVUE-370) INJECTION 76% IV COMPARISON:  06/28/2005 FINDINGS: Cardiovascular: Aorta normal caliber without aneurysm or dissection. Pulmonary arteries well opacified and patent. No evidence of pulmonary embolism. No pericardial effusion. Mediastinum/Nodes: Esophagus unremarkable. Base of cervical region normal appearance. No thoracic adenopathy. Lungs/Pleura: Lungs clear. No pulmonary infiltrate, pleural effusion or pneumothorax. 3 mm RIGHT lower lobe nodule image 40, previously 4 mm. Upper Abdomen: Visualized upper abdomen normal appearance. Musculoskeletal: Unremarkable Review of the MIP images confirms the above findings. IMPRESSION: No evidence of pulmonary embolism. Stable RIGHT lower lobe nodule since 2007, benign. No acute intrathoracic abnormalities. Electronically Signed   By: Ulyses Southward M.D.   On: 07/26/2017 19:19    ____________________________________________   PROCEDURES  Procedure(s) performed: None  Procedures  Critical Care performed: No ____________________________________________   INITIAL IMPRESSION / ASSESSMENT AND PLAN / ED COURSE  Pertinent labs & imaging results that were available during my care of the patient were reviewed by me and considered in my medical decision making (see chart for details).  52 y.o. female with a  history of hyperlipidemia and obesity presenting with ongoing pain below the right scapula worse with walking.  Overall, the patient is mildly hypertensive but afebrile.  I do not see any evidence of zoster or other rash in the area.  A musculoskeletal pain is possible that the patient has not had any cough or trauma or strain which would have resulted in such significant discomfort.  The patient has no abdominal discomfort, but I will add a lipase and hepatic function panel to her laboratory studies.  In addition, we will do a d-dimer to evaluate for PE although she has no hypoxia, tachycardia, or evidence of DVT; this diagnosis is  much less likely.  This patient's pain is also unlikely to be cardiac in origin and today her EKG does not show ischemic changes, she has negative troponin with a second troponin pending, and her chest x-ray does not show any acute cardiopulmonary process.  I will plan to treat the patient with Toradol and Tylenol, and reevaluate her for final disposition after her laboratory studies result.  ----------------------------------------- 8:15 PM on 07/26/2017 -----------------------------------------  The patient continues to be hemodynamically stable with normal oxygenation.  She has received Toradol which has not helped.  Her workup includes a normal lipase, normal liver function tests, and a CT scan does not show any PE or other cause for her acute pain.  I have given her all her results and she understands follow-up instructions as well as return precautions.  I have recommended that she increase her hydrocodone dose to 2 tablets of the 5/325 to see if this can help.  ____________________________________________  FINAL CLINICAL IMPRESSION(S) / ED DIAGNOSES  Final diagnoses:  Acute right-sided low back pain without sciatica         NEW MEDICATIONS STARTED DURING THIS VISIT:  New Prescriptions   PREDNISONE (DELTASONE) 20 MG TABLET    Take 3 tablets (60 mg total) by mouth daily.      Rockne Menghini, MD 07/26/17 2016

## 2017-08-02 ENCOUNTER — Other Ambulatory Visit: Payer: Self-pay | Admitting: Obstetrics and Gynecology

## 2017-08-02 DIAGNOSIS — Z1231 Encounter for screening mammogram for malignant neoplasm of breast: Secondary | ICD-10-CM

## 2017-08-30 ENCOUNTER — Ambulatory Visit
Admission: RE | Admit: 2017-08-30 | Discharge: 2017-08-30 | Disposition: A | Discharge status: Home / Self Care | Payer: 59 | Source: Ambulatory Visit | Attending: Obstetrics and Gynecology | Admitting: Obstetrics and Gynecology

## 2017-08-30 DIAGNOSIS — Z1231 Encounter for screening mammogram for malignant neoplasm of breast: Secondary | ICD-10-CM | POA: Insufficient documentation

## 2017-10-06 ENCOUNTER — Encounter
Admission: RE | Admit: 2017-10-06 | Discharge: 2017-10-06 | Disposition: A | Discharge status: Home / Self Care | Payer: 59 | Source: Ambulatory Visit | Attending: Obstetrics and Gynecology | Admitting: Obstetrics and Gynecology

## 2017-10-06 ENCOUNTER — Other Ambulatory Visit: Payer: Self-pay

## 2017-10-06 DIAGNOSIS — Z01818 Encounter for other preprocedural examination: Secondary | ICD-10-CM | POA: Insufficient documentation

## 2017-10-06 HISTORY — DX: Gastro-esophageal reflux disease without esophagitis: K21.9

## 2017-10-06 HISTORY — DX: Pure hypercholesterolemia, unspecified: E78.00

## 2017-10-06 LAB — CBC
HCT: 40.1 % (ref 35.0–47.0)
Hemoglobin: 13.8 g/dL (ref 12.0–16.0)
MCH: 29.9 pg (ref 26.0–34.0)
MCHC: 34.4 g/dL (ref 32.0–36.0)
MCV: 86.9 fL (ref 80.0–100.0)
PLATELETS: 252 10*3/uL (ref 150–440)
RBC: 4.62 MIL/uL (ref 3.80–5.20)
RDW: 13.5 % (ref 11.5–14.5)
WBC: 7.5 10*3/uL (ref 3.6–11.0)

## 2017-10-06 LAB — BASIC METABOLIC PANEL
Anion gap: 12 (ref 5–15)
BUN: 10 mg/dL (ref 6–20)
CALCIUM: 9.1 mg/dL (ref 8.9–10.3)
CHLORIDE: 105 mmol/L (ref 101–111)
CO2: 20 mmol/L — ABNORMAL LOW (ref 22–32)
CREATININE: 0.8 mg/dL (ref 0.44–1.00)
GFR calc non Af Amer: >60 mL/min (ref >60)
Glucose, Bld: 97 mg/dL (ref 65–99)
Potassium: 3.9 mmol/L (ref 3.5–5.1)
SODIUM: 137 mmol/L (ref 135–145)

## 2017-10-06 LAB — TYPE AND SCREEN
ABO/RH(D): O NEG
Antibody Screen: NEGATIVE

## 2017-10-06 NOTE — H&P (Signed)
Laura Gilmore is a 52 y.o. female here for Pre Op Consulting (sign consents) .  HPI:  Pt presents for a preoperative visit to schedule a D&C, hysteroscopy, and IUD placement.  She has a hx of:  Heavy bleeding without being able to enter uterine cavity. On ultrasound/SIS- Severely retroverted uterus with cesarean scar, small cervix, very tender vaginal walls. Ovaries not seen. Uterus not seen well.  Workup has included: - Uterus: 9x6x5cm - Ovaries: neither ovary seen - Other: prior cesarean section, uterus is retroverted and scarred - endometrial stripe is unable to be confidently measured or seen   Past Medical History:  has a past medical history of Hyperlipidemia, Menorrhagia, and Pelvic pain in female.  Past Surgical History:  has a past surgical history that includes Cesarean section (2006). Family History: family history includes Cancer in her mother; Diabetes type II in her father and sister; Kidney disease in her sister; Lung cancer in her maternal grandfather; Myocardial Infarction (Heart attack) in her paternal grandfather; Myocardial Infarction (Heart attack) (age of onset: 50) in her father; Stroke in her father. Social History:  reports that she has quit smoking. She has a 0.75 pack-year smoking history. She has never used smokeless tobacco. She reports that she drinks alcohol. She reports that she does not use drugs. OB/GYN History:  OB History    Gravida  1   Para  1   Term  1   Preterm      AB      Living  1     SAB      TAB      Ectopic      Molar      Multiple      Live Births             Allergies: has No Known Allergies. Medications:  Current Outpatient Medications:  .  aspirin 81 MG EC tablet, Take 81 mg by mouth once daily., Disp: , Rfl:  .  atorvastatin (LIPITOR) 10 MG tablet, Take 1 tablet (10 mg total) by mouth once daily, Disp: 90 tablet, Rfl: 2 .  dicyclomine (BENTYL) 20 mg tablet, Take 1 tablet (20 mg total) by mouth every 6 (six)  hours (Patient not taking: Reported on 07/24/2017 ), Disp: 30 tablet, Rfl: 0 .  drospirenone-ethinyl estradiol (YASMIN) 3-0.03 mg tablet, Take 1 tablet by mouth once daily May skip placebo week for 3 months, Disp: 4 Package, Rfl: 0 .  HYDROcodone-acetaminophen (NORCO) 5-325 mg tablet, Take one tablet at night for pain; may take up to every 6 hours as needed for pain if not working or driving (Patient not taking: Reported on 08/02/2017 ), Disp: 20 tablet, Rfl: 0 .  metaxalone (SKELAXIN) 800 mg tablet, Take 1 tablet (800 mg total) by mouth 3 (three) times daily (Patient not taking: Reported on 08/02/2017 ), Disp: 24 tablet, Rfl: 0 .  MULTIVIT WITH CALCIUM,IRON,MIN (WOMEN'S DAILY MULTIVITAMIN ORAL), Take 1 tablet by mouth once daily., Disp: , Rfl:   Review of Systems: No SOB, no palpitations or chest pain, no new lower extremity edema, no nausea or vomiting or bowel or bladder complaints. See HPI for gyn specific ROS.   Exam:   Vitals:   10/06/17 1137  BP: 121/88  Pulse: 78    WDWN   female in NAD Body mass index is 44.48 kg/m.  General: Patient is well-groomed, well-nourished, appears stated age in no acute distress  HEENT: head is atraumatic and normocephalic, trachea is midline, neck is supple  with no palpable nodules  CV: Regular rhythm and normal heart rate, no murmur  Pulm: Clear to auscultation throughout lung fields with no wheezing, crackles, or rhonchi. No increased work of breathing  Abdomen: soft , no mass, non-tender, no rebound tenderness, no hepatomegaly  Pelvic: Deferred  Impression:   The encounter diagnosis was Preop examination.    Plan:   -  Preoperative visit: D&C hysteroscopy, . Consents signed today. Risks of surgery were discussed with the patient including but not limited to: bleeding which may require transfusion; infection which may require antibiotics; injury to uterus or surrounding organs; intrauterine scarring which may impair future fertility; need  for additional procedures including laparotomy or laparoscopy; and other postoperative/anesthesia complications. Written informed consent was obtained.  Cervical dilation unable to be done in office. She is worried about postop nausea.  This is a scheduled same-day surgery. She will have a postop visit in 2 weeks to review operative findings and pathology.  Return for Postop check.  Cecilie Kicks, MD

## 2017-10-06 NOTE — Patient Instructions (Signed)
Your procedure is scheduled on: Friday, October 14, 2017 Report to Day Surgery on the 2nd floor of the [facility] To find out your arrival time, please call [phone] between 1PM - 3PM on: Thursday, October 13, 2017  REMEMBER: Instructions that are not followed completely may result in serious medical risk, up to and including death; or upon the discretion of your surgeon and anesthesiologist your surgery may need to be rescheduled.  Do not eat food after midnight the night before your procedure.  No gum chewing, lozengers or hard candies.  You may however, drink CLEAR liquids up to 2 hours before you are scheduled to arrive for your surgery. Do not drink anything within 2 hours of the start of your surgery.  Clear liquids include: - water  - apple juice without pulp - clear gatorade - black coffee or tea (Do NOT add anything to the coffee or tea) Do NOT drink anything that is not on this list.  No Alcohol for 24 hours before or after surgery.  No Smoking including e-cigarettes for 24 hours prior to surgery.  No chewable tobacco products for at least 6 hours prior to surgery.  No nicotine patches on the day of surgery.  On the morning of surgery brush your teeth with toothpaste and water, you may rinse your mouth with mouthwash if you wish. Do not swallow any toothpaste or mouthwash.  Notify your doctor if there is any change in your medical condition (cold, fever, infection).  Do not wear jewelry, make-up, hairpins, clips or nail polish.  Do not wear lotions, powders, or perfumes. You may wear deodorant.  Do not shave 48 hours prior to surgery.   Contacts and dentures may not be worn into surgery.  Do not bring valuables to the hospital, including drivers license, insurance or credit cards.  [facility] is not responsible for any belongings or valuables.   TAKE THESE MEDICATIONS THE MORNING OF SURGERY:  none  NOW!  Stop aspirin and Anti-inflammatories (NSAIDS) such as  Advil, Aleve, Ibuprofen, Motrin, Naproxen, Naprosyn and Aspirin based products such as Excedrin, Goodys Powder, BC Powder. (May take Tylenol or Acetaminophen if needed.)  NOW!  Stop ANY OVER THE COUNTER supplements until after surgery.  Wear comfortable clothing (specific to your surgery type) to the hospital.  Plan for stool softeners for home use.  If you are being discharged the day of surgery, you will not be allowed to drive home. You will need a responsible adult to drive you home and stay with you that night.   If you are taking public transportation, you will need to have a responsible adult with you. Please confirm with your physician that it is acceptable to use public transportation.   Please call [phone] if you have any questions about these instructions.

## 2017-10-14 ENCOUNTER — Other Ambulatory Visit: Payer: Self-pay

## 2017-10-14 ENCOUNTER — Ambulatory Visit
Admission: RE | Admit: 2017-10-14 | Discharge: 2017-10-14 | Disposition: A | Discharge status: Home / Self Care | Payer: 59 | Source: Ambulatory Visit | Attending: Obstetrics and Gynecology | Admitting: Obstetrics and Gynecology

## 2017-10-14 ENCOUNTER — Ambulatory Visit: Payer: 59 | Admitting: Anesthesiology

## 2017-10-14 ENCOUNTER — Encounter
Admission: RE | Disposition: A | Discharge status: Home / Self Care | Payer: Self-pay | Source: Ambulatory Visit | Attending: Obstetrics and Gynecology

## 2017-10-14 ENCOUNTER — Encounter: Payer: Self-pay | Admitting: *Deleted

## 2017-10-14 DIAGNOSIS — E7800 Pure hypercholesterolemia, unspecified: Secondary | ICD-10-CM | POA: Diagnosis not present

## 2017-10-14 DIAGNOSIS — Z6841 Body Mass Index (BMI) 40.0 and over, adult: Secondary | ICD-10-CM | POA: Insufficient documentation

## 2017-10-14 DIAGNOSIS — N920 Excessive and frequent menstruation with regular cycle: Secondary | ICD-10-CM | POA: Diagnosis present

## 2017-10-14 DIAGNOSIS — Z7982 Long term (current) use of aspirin: Secondary | ICD-10-CM | POA: Diagnosis not present

## 2017-10-14 DIAGNOSIS — K219 Gastro-esophageal reflux disease without esophagitis: Secondary | ICD-10-CM | POA: Diagnosis not present

## 2017-10-14 DIAGNOSIS — Z79899 Other long term (current) drug therapy: Secondary | ICD-10-CM | POA: Diagnosis not present

## 2017-10-14 DIAGNOSIS — N840 Polyp of corpus uteri: Secondary | ICD-10-CM | POA: Diagnosis not present

## 2017-10-14 HISTORY — PX: INTRAUTERINE DEVICE (IUD) INSERTION: SHX5877

## 2017-10-14 HISTORY — PX: POLYPECTOMY: SHX5525

## 2017-10-14 HISTORY — PX: HYSTEROSCOPY WITH D & C: SHX1775

## 2017-10-14 LAB — ABO/RH: ABO/RH(D): O NEG

## 2017-10-14 LAB — POCT PREGNANCY, URINE: Preg Test, Ur: NEGATIVE

## 2017-10-14 SURGERY — DILATATION AND CURETTAGE /HYSTEROSCOPY
Anesthesia: General | Site: Uterus | Wound class: Clean Contaminated

## 2017-10-14 MED ORDER — FAMOTIDINE 20 MG PO TABS
20.0000 mg | ORAL_TABLET | Freq: Once | ORAL | Status: AC
  Administered 2017-10-14: 20 mg via ORAL

## 2017-10-14 MED ORDER — PROPOFOL 10 MG/ML IV BOLUS
INTRAVENOUS | Status: AC
  Filled 2017-10-14: qty 20

## 2017-10-14 MED ORDER — ACETAMINOPHEN 650 MG RE SUPP
650.0000 mg | RECTAL | Status: DC | PRN
  Filled 2017-10-14: qty 1

## 2017-10-14 MED ORDER — PROPOFOL 500 MG/50ML IV EMUL
INTRAVENOUS | Status: AC
  Filled 2017-10-14: qty 50

## 2017-10-14 MED ORDER — PROMETHAZINE HCL 25 MG/ML IJ SOLN: 6.2500 mg | INTRAMUSCULAR | Status: DC | PRN

## 2017-10-14 MED ORDER — PROPOFOL 500 MG/50ML IV EMUL
INTRAVENOUS | Status: DC | PRN
  Administered 2017-10-14: 175 ug/kg/min via INTRAVENOUS

## 2017-10-14 MED ORDER — LACTATED RINGERS IV SOLN
INTRAVENOUS | Status: DC
  Administered 2017-10-14: 15:00:00 via INTRAVENOUS

## 2017-10-14 MED ORDER — FENTANYL CITRATE (PF) 100 MCG/2ML IJ SOLN
25.0000 ug | INTRAMUSCULAR | Status: DC | PRN
  Administered 2017-10-14: 25 ug via INTRAVENOUS

## 2017-10-14 MED ORDER — FENTANYL CITRATE (PF) 100 MCG/2ML IJ SOLN
INTRAMUSCULAR | Status: AC
  Filled 2017-10-14: qty 2

## 2017-10-14 MED ORDER — ACETAMINOPHEN 325 MG PO TABS: 650.0000 mg | ORAL_TABLET | ORAL | Status: DC | PRN

## 2017-10-14 MED ORDER — MIDAZOLAM HCL 2 MG/2ML IJ SOLN
INTRAMUSCULAR | Status: AC
  Filled 2017-10-14: qty 2

## 2017-10-14 MED ORDER — DEXAMETHASONE SODIUM PHOSPHATE 10 MG/ML IJ SOLN
INTRAMUSCULAR | Status: DC | PRN
  Administered 2017-10-14: 10 mg via INTRAVENOUS

## 2017-10-14 MED ORDER — ONDANSETRON HCL 4 MG/2ML IJ SOLN
INTRAMUSCULAR | Status: DC | PRN
  Administered 2017-10-14: 4 mg via INTRAVENOUS

## 2017-10-14 MED ORDER — KETOROLAC TROMETHAMINE 30 MG/ML IJ SOLN
30.0000 mg | Freq: Four times a day (QID) | INTRAMUSCULAR | Status: DC
  Filled 2017-10-14: qty 1

## 2017-10-14 MED ORDER — PROPOFOL 10 MG/ML IV BOLUS
INTRAVENOUS | Status: DC | PRN
  Administered 2017-10-14: 150 mg via INTRAVENOUS
  Administered 2017-10-14: 50 mg via INTRAVENOUS

## 2017-10-14 MED ORDER — MORPHINE SULFATE (PF) 4 MG/ML IV SOLN: 1.0000 mg | INTRAVENOUS | Status: DC | PRN

## 2017-10-14 MED ORDER — LACTATED RINGERS IV SOLN: INTRAVENOUS | Status: DC

## 2017-10-14 MED ORDER — IBUPROFEN 200 MG PO TABS: 600.0000 mg | ORAL_TABLET | Freq: Four times a day (QID) | ORAL | 0 refills | Status: DC | PRN

## 2017-10-14 MED ORDER — LIDOCAINE HCL (CARDIAC) PF 100 MG/5ML IV SOSY
PREFILLED_SYRINGE | INTRAVENOUS | Status: DC | PRN
  Administered 2017-10-14: 100 mg via INTRAVENOUS

## 2017-10-14 MED ORDER — FAMOTIDINE 20 MG PO TABS
ORAL_TABLET | ORAL | Status: AC
  Administered 2017-10-14: 20 mg via ORAL
  Filled 2017-10-14: qty 1

## 2017-10-14 MED ORDER — SILVER NITRATE-POT NITRATE 75-25 % EX MISC
CUTANEOUS | Status: DC | PRN
  Administered 2017-10-14: 3

## 2017-10-14 MED ORDER — SCOPOLAMINE 1 MG/3DAYS TD PT72
MEDICATED_PATCH | TRANSDERMAL | Status: AC
  Filled 2017-10-14: qty 1

## 2017-10-14 MED ORDER — SCOPOLAMINE 1 MG/3DAYS TD PT72
1.0000 | MEDICATED_PATCH | TRANSDERMAL | Status: DC
  Administered 2017-10-14: 1.5 mg via TRANSDERMAL

## 2017-10-14 SURGICAL SUPPLY — 19 items
BAG INFUSER PRESSURE 100CC (MISCELLANEOUS) ×4 IMPLANT
CANISTER SUCT 3000ML PPV (MISCELLANEOUS) ×4 IMPLANT
CATH ROBINSON RED A/P 16FR (CATHETERS) ×4 IMPLANT
ELECT REM PT RETURN 9FT ADLT (ELECTROSURGICAL) ×4
ELECTRODE REM PT RTRN 9FT ADLT (ELECTROSURGICAL) ×2 IMPLANT
GLOVE BIO SURGEON STRL SZ7 (GLOVE) ×4 IMPLANT
GLOVE INDICATOR 7.5 STRL GRN (GLOVE) ×4 IMPLANT
GOWN STRL REUS W/ TWL LRG LVL3 (GOWN DISPOSABLE) ×4 IMPLANT
GOWN STRL REUS W/TWL LRG LVL3 (GOWN DISPOSABLE) ×4
IV LACTATED RINGERS 1000ML (IV SOLUTION) ×4 IMPLANT
KIT TURNOVER CYSTO (KITS) ×4 IMPLANT
MYOSURE LITE POLYP REMOVAL (MISCELLANEOUS) ×4 IMPLANT
PACK DNC HYST (MISCELLANEOUS) ×4 IMPLANT
PAD OB MATERNITY 4.3X12.25 (PERSONAL CARE ITEMS) ×4 IMPLANT
PAD PREP 24X41 OB/GYN DISP (PERSONAL CARE ITEMS) ×4 IMPLANT
TOWEL OR 17X26 4PK STRL BLUE (TOWEL DISPOSABLE) ×4 IMPLANT
TUBING CONNECTING 10 (TUBING) ×3 IMPLANT
TUBING CONNECTING 10' (TUBING) ×1
TUBING HYSTEROSCOPY DOLPHIN (MISCELLANEOUS) ×4 IMPLANT

## 2017-10-14 NOTE — Discharge Instructions (Signed)
AMBULATORY SURGERY  DISCHARGE INSTRUCTIONS   1) The drugs that you were given will stay in your system until tomorrow so for the next 24 hours you should not:  A) Drive an automobile B) Make any legal decisions C) Drink any alcoholic beverage   2) You may resume regular meals tomorrow.  Today it is better to start with liquids and gradually work up to solid foods.  You may eat anything you prefer, but it is better to start with liquids, then soup and crackers, and gradually work up to solid foods.   3) Please notify your doctor immediately if you have any unusual bleeding, trouble breathing, redness and pain at the surgery site, drainage, fever, or pain not relieved by medication.    4) Additional Instructions:        Please contact your physician with any problems or Same Day Surgery at [phone], Monday through Friday 6 am to 4 pm, or [place] [facility] [place] Main number at [phone].You should expect to have some cramping and vaginal bleeding for about a week. This should taper off and subside, much like a period. If heavy bleeding continues or gets worse, you should contact the office for an earlier appointment.   Please call the office or physician on call for fever >101, severe pain, and heavy bleeding.   [phone]  NOTHING IN THE VAGINA FOR 2 WEEKS!!  Dr. Ward will discuss pathology results with you at your postop visit.  

## 2017-10-14 NOTE — Op Note (Signed)
Operative Report Hysteroscopy, Dilation and Curettage IUD insertion 10/14/2017  Patient:  Laura Gilmore  52 y.o. female Preoperative diagnosis:  menorrhagia, anteverted uterus, pelvic pain, BMI 45 Postoperative diagnosis:  menorrhagia, anteverted uterus, pelvic pain, BMI 45  PROCEDURE:  Procedure(s): DILATATION AND CURETTAGE /HYSTEROSCOPY (N/A) INTRAUTERINE DEVICE (IUD) INSERTION (N/A) POLYPECTOMY (N/A) Surgeon:  Moishe SpiceSurgeon(s) and Role:    * Jahrel Borthwick, MD - Primary Anesthesia:  LMA I/O: Total I/O In: 400 [I.V.:400] Out: 25 [Blood:25] Specimens:  Endometrial curettings, endometrial polyp Complications: None Apparent Disposition:  VS stable to PACU  Findings: Uterus, mobile, normal size, anteverted, sounding to 10 cm; normal cervix, vagina, perineum.  Hysteroscopy: anterior, cavity-filling polyp.  Patent ostea bilaterally IUD inserted without difficulty  Indication for procedure/Consents: 52 y.o.  here for scheduled surgery for the aforementioned diagnoses. Risks of surgery were discussed with the patient including but not limited to: bleeding which may require transfusion; infection which may require antibiotics; injury to uterus or surrounding organs; intrauterine scarring which may impair future fertility; need for additional procedures including laparotomy or laparoscopy; and other postoperative/anesthesia complications. Written informed consent was obtained.    Procedure Details:   The patient was then taken to the operating room where anesthesia was administered and was found to be adequate.  After a formal timeout was performed, she was placed in the dorsal lithotomy position and examined with the above findings. She was then prepped and draped in the sterile manner.  A speculum was then placed in the patient's vagina and a single tooth tenaculum was applied to the anterior lip of the cervix.    The uterus was sounded to 10cm. Her cervix was serially dilated to accommodate  the myoscope, with findings as above. The polyp forceps were advanced into the uterus, but after unsuccessful attempts to remove the polyp intact, the Myosure was used.  The mass was removed in its entirety.  A sharp curettage was then performed until there was a gritty texture in all four quadrants. The specimens were handed off to nursing.  The camera was reinserted and confirmed the uterus had been evacuated. The IUD device was placed into the uterine cavity to the fundus, and deployed in typical fashion. Strings were cut at 3cm from the cervix.  The tenaculum was removed from the anterior lip of the cervix, silver nitrate was used, and the vaginal speculum was removed after noting good hemostasis. The patient tolerated the procedure well and was taken to the recovery area awake, extubated and in stable condition.  The patient will be discharged to home as per PACU criteria.  Routine postoperative instructions given. She will follow up in the clinic in two to four weeks for postoperative evaluation.  Ranae Plumberhelsea Verginia Toohey, MD [facility] Attending Gynecologist

## 2017-10-14 NOTE — Anesthesia Procedure Notes (Signed)
Procedure Name: LMA Insertion Date/Time: 10/14/2017 3:57 PM Performed by: Junious SilkNoles, Jaquawn Saffran, CRNA Pre-anesthesia Checklist: Patient identified, Patient being monitored, Timeout performed, Emergency Drugs available and Suction available Patient Re-evaluated:Patient Re-evaluated prior to induction Oxygen Delivery Method: Circle system utilized Preoxygenation: Pre-oxygenation with 100% oxygen Induction Type: IV induction Ventilation: Mask ventilation without difficulty LMA: LMA inserted LMA Size: 4.0 Tube type: Oral Number of attempts: 1 Placement Confirmation: positive ETCO2 and breath sounds checked- equal and bilateral Tube secured with: Tape Dental Injury: Teeth and Oropharynx as per pre-operative assessment

## 2017-10-14 NOTE — Interval H&P Note (Signed)
History and Physical Interval Note:  10/14/2017 3:33 PM  Laura Gilmore  has presented today for surgery, with the diagnosis of menorrhagia, anteverted uterus, pelvic pain, BMI 45  The various methods of treatment have been discussed with the patient and family. After consideration of risks, benefits and other options for treatment, the patient has consented to  Procedure(s): DILATATION AND CURETTAGE /HYSTEROSCOPY (N/A) INTRAUTERINE DEVICE (IUD) INSERTION (N/A) as a surgical intervention .  The patient's history has been reviewed, patient examined, no change in status, stable for surgery.  I have reviewed the patient's chart and labs.  Questions were answered to the patient's satisfaction.    Due to scheduling conflicts, Dr Leafy Ro has asked me to perform this procedure.  The patient is aware and agrees.  We have met and discussed her case and answered questions.  Reviewed risks and benefits.   [place]

## 2017-10-14 NOTE — Anesthesia Post-op Follow-up Note (Signed)
Anesthesia QCDR form completed.        

## 2017-10-14 NOTE — Anesthesia Preprocedure Evaluation (Addendum)
Anesthesia Evaluation  Patient identified by MRN, date of birth, ID band Patient awake    Reviewed: Allergy & Precautions, H&P , NPO status , Patient's Chart, lab work & pertinent test results, reviewed documented beta blocker date and time   History of Anesthesia Complications (+) PONV and history of anesthetic complications  Airway Mallampati: I  TM Distance: >3 FB Neck ROM: full    Dental  (+) Dental Advidsory Given, Teeth Intact   Pulmonary neg pulmonary ROS,           Cardiovascular Exercise Tolerance: Good negative cardio ROS       Neuro/Psych negative neurological ROS  negative psych ROS   GI/Hepatic Neg liver ROS, GERD  ,  Endo/Other  neg diabetesMorbid obesity  Renal/GU negative Renal ROS  negative genitourinary   Musculoskeletal   Abdominal   Peds  Hematology negative hematology ROS (+)   Anesthesia Other Findings Past Medical History: No date: GERD (gastroesophageal reflux disease) No date: Hypercholesterolemia   Reproductive/Obstetrics negative OB ROS                            Anesthesia Physical Anesthesia Plan  ASA: III  Anesthesia Plan: General   Post-op Pain Management:    Induction: Intravenous  PONV Risk Score and Plan: Ondansetron, Dexamethasone and TIVA  Airway Management Planned: LMA  Additional Equipment:   Intra-op Plan:   Post-operative Plan: Extubation in OR  Informed Consent: I have reviewed the patients History and Physical, chart, labs and discussed the procedure including the risks, benefits and alternatives for the proposed anesthesia with the patient or authorized representative who has indicated his/her understanding and acceptance.   Dental Advisory Given  Plan Discussed with: Anesthesiologist, CRNA and Surgeon  Anesthesia Plan Comments:         Anesthesia Quick Evaluation

## 2017-10-14 NOTE — Transfer of Care (Signed)
Immediate Anesthesia Transfer of Care Note  Patient: Laura Gilmore  Procedure(s) Performed: DILATATION AND CURETTAGE /HYSTEROSCOPY (N/A Uterus) INTRAUTERINE DEVICE (IUD) INSERTION (N/A Uterus) POLYPECTOMY (N/A )  Patient Location: PACU  Anesthesia Type:General  Level of Consciousness: awake, alert  and oriented  Airway & Oxygen Therapy: Patient Spontanous Breathing and Patient connected to face mask oxygen  Post-op Assessment: Report given to RN and Post -op Vital signs reviewed and stable  Post vital signs: Reviewed and stable  Last Vitals:  Vitals Value Taken Time  BP    Temp    Pulse 71 10/14/2017  4:25 PM  Resp 14 10/14/2017  4:25 PM  SpO2 100 % 10/14/2017  4:25 PM  Vitals shown include unvalidated device data.  Last Pain:  Vitals:   10/14/17 1502  TempSrc: Oral  PainSc: 0-No pain         Complications: No apparent anesthesia complications

## 2017-10-17 ENCOUNTER — Encounter: Payer: Self-pay | Admitting: Obstetrics and Gynecology

## 2017-10-17 NOTE — Anesthesia Postprocedure Evaluation (Signed)
Anesthesia Post Note  Patient: Laura Gilmore  Procedure(s) Performed: DILATATION AND CURETTAGE /HYSTEROSCOPY (N/A Uterus) INTRAUTERINE DEVICE (IUD) INSERTION (N/A Uterus) POLYPECTOMY (N/A )  Patient location during evaluation: PACU Anesthesia Type: General Level of consciousness: awake and alert Pain management: pain level controlled Vital Signs Assessment: post-procedure vital signs reviewed and stable Respiratory status: spontaneous breathing, nonlabored ventilation, respiratory function stable and patient connected to nasal cannula oxygen Cardiovascular status: blood pressure returned to baseline and stable Postop Assessment: no apparent nausea or vomiting Anesthetic complications: no     Last Vitals:  Vitals:   10/14/17 1654 10/14/17 1709  BP: 134/77   Pulse: 78 81  Resp: 15 18  Temp: 36.8 C 36.6 C  SpO2: 100% 100%    Last Pain:  Vitals:   10/17/17 0813  TempSrc:   PainSc: 5                  Lenard SimmerAndrew Melven Stockard

## 2017-10-18 ENCOUNTER — Encounter: Payer: Self-pay | Admitting: Obstetrics & Gynecology

## 2017-10-18 LAB — SURGICAL PATHOLOGY

## 2018-01-05 DIAGNOSIS — E1122 Type 2 diabetes mellitus with diabetic chronic kidney disease: Secondary | ICD-10-CM | POA: Insufficient documentation

## 2018-01-05 DIAGNOSIS — E119 Type 2 diabetes mellitus without complications: Secondary | ICD-10-CM | POA: Insufficient documentation

## 2018-02-09 ENCOUNTER — Ambulatory Visit: Payer: Self-pay | Admitting: Dietician

## 2018-02-17 ENCOUNTER — Encounter: Payer: Self-pay | Admitting: Dietician

## 2018-02-17 NOTE — Progress Notes (Signed)
Patient cancelled her RD visit as MNT is not a covered benefit for her. Sent letter to referring provider.

## 2018-03-24 ENCOUNTER — Ambulatory Visit: Payer: 59 | Admitting: Certified Registered Nurse Anesthetist

## 2018-03-24 ENCOUNTER — Encounter
Admission: RE | Disposition: A | Discharge status: Home / Self Care | Payer: Self-pay | Source: Ambulatory Visit | Attending: Gastroenterology

## 2018-03-24 ENCOUNTER — Encounter: Payer: Self-pay | Admitting: *Deleted

## 2018-03-24 ENCOUNTER — Ambulatory Visit
Admission: RE | Admit: 2018-03-24 | Discharge: 2018-03-24 | Disposition: A | Discharge status: Home / Self Care | Payer: 59 | Source: Ambulatory Visit | Attending: Gastroenterology | Admitting: Gastroenterology

## 2018-03-24 DIAGNOSIS — E039 Hypothyroidism, unspecified: Secondary | ICD-10-CM | POA: Diagnosis not present

## 2018-03-24 DIAGNOSIS — Z8371 Family history of colonic polyps: Secondary | ICD-10-CM | POA: Insufficient documentation

## 2018-03-24 DIAGNOSIS — Z7982 Long term (current) use of aspirin: Secondary | ICD-10-CM | POA: Insufficient documentation

## 2018-03-24 DIAGNOSIS — E7800 Pure hypercholesterolemia, unspecified: Secondary | ICD-10-CM | POA: Diagnosis not present

## 2018-03-24 DIAGNOSIS — Z79890 Hormone replacement therapy (postmenopausal): Secondary | ICD-10-CM | POA: Diagnosis not present

## 2018-03-24 DIAGNOSIS — Z6841 Body Mass Index (BMI) 40.0 and over, adult: Secondary | ICD-10-CM | POA: Insufficient documentation

## 2018-03-24 DIAGNOSIS — Z79899 Other long term (current) drug therapy: Secondary | ICD-10-CM | POA: Insufficient documentation

## 2018-03-24 DIAGNOSIS — Z1211 Encounter for screening for malignant neoplasm of colon: Secondary | ICD-10-CM | POA: Diagnosis present

## 2018-03-24 HISTORY — DX: Hypothyroidism, unspecified: E03.9

## 2018-03-24 HISTORY — PX: COLONOSCOPY WITH PROPOFOL: SHX5780

## 2018-03-24 LAB — POCT PREGNANCY, URINE: PREG TEST UR: NEGATIVE

## 2018-03-24 SURGERY — COLONOSCOPY WITH PROPOFOL
Anesthesia: General

## 2018-03-24 MED ORDER — PROMETHAZINE HCL 25 MG/ML IJ SOLN
INTRAMUSCULAR | Status: DC | PRN
  Administered 2018-03-24: 12.5 mg via INTRAVENOUS

## 2018-03-24 MED ORDER — SODIUM CHLORIDE 0.9 % IV SOLN
INTRAVENOUS | Status: DC
  Administered 2018-03-24: 1000 mL via INTRAVENOUS

## 2018-03-24 MED ORDER — DEXAMETHASONE SODIUM PHOSPHATE 4 MG/ML IJ SOLN
INTRAMUSCULAR | Status: DC | PRN
  Administered 2018-03-24: 4 mg via INTRAVENOUS

## 2018-03-24 MED ORDER — ONDANSETRON HCL 4 MG/2ML IJ SOLN
INTRAMUSCULAR | Status: AC
  Filled 2018-03-24: qty 2

## 2018-03-24 MED ORDER — PROPOFOL 500 MG/50ML IV EMUL
INTRAVENOUS | Status: DC | PRN
  Administered 2018-03-24: 100 ug/kg/min via INTRAVENOUS

## 2018-03-24 MED ORDER — SCOPOLAMINE 1 MG/3DAYS TD PT72
MEDICATED_PATCH | TRANSDERMAL | Status: DC | PRN
  Administered 2018-03-24: 1 via TRANSDERMAL

## 2018-03-24 MED ORDER — SCOPOLAMINE 1 MG/3DAYS TD PT72
MEDICATED_PATCH | TRANSDERMAL | Status: AC
  Filled 2018-03-24: qty 1

## 2018-03-24 MED ORDER — LIDOCAINE HCL (CARDIAC) PF 100 MG/5ML IV SOSY
PREFILLED_SYRINGE | INTRAVENOUS | Status: DC | PRN
  Administered 2018-03-24: 25 mg via INTRATRACHEAL

## 2018-03-24 MED ORDER — ONDANSETRON HCL 4 MG/2ML IJ SOLN
INTRAMUSCULAR | Status: DC | PRN
  Administered 2018-03-24: 4 mg via INTRAVENOUS

## 2018-03-24 MED ORDER — PROPOFOL 10 MG/ML IV BOLUS
INTRAVENOUS | Status: DC | PRN
  Administered 2018-03-24: 80 mg via INTRAVENOUS

## 2018-03-24 MED ORDER — PROMETHAZINE HCL 25 MG/ML IJ SOLN
INTRAMUSCULAR | Status: AC
  Filled 2018-03-24: qty 1

## 2018-03-24 MED ORDER — PROPOFOL 500 MG/50ML IV EMUL
INTRAVENOUS | Status: AC
  Filled 2018-03-24: qty 50

## 2018-03-24 MED ORDER — DEXAMETHASONE SODIUM PHOSPHATE 10 MG/ML IJ SOLN
INTRAMUSCULAR | Status: AC
  Filled 2018-03-24: qty 1

## 2018-03-24 NOTE — Op Note (Signed)
[facility] Gastroenterology Patient Name: Laura Gilmore Procedure Date: 03/24/2018 10:36 AM MRN: [id number] Account #: [id number] Date of Birth: 03/30/1966 Admit Type: Outpatient Age: [id number] [facility] Gender: Female Note Status: Finalized Procedure:            Colonoscopy Indications:          Family history of colonic polyps in a first-degree                        relative Providers:            Christena DeemMartin U. Skulskie, MD Referring MD:         No Local Md, MD (Referring MD) Medicines:            Monitored Anesthesia Care Complications:        No immediate complications. Procedure:            Pre-Anesthesia Assessment:                       - ASA Grade Assessment: III - A patient with severe                        systemic disease.                       After obtaining informed consent, the colonoscope was                        passed under direct vision. Throughout the procedure,                        the patient's blood pressure, pulse, and oxygen                        saturations were monitored continuously. The                        Colonoscope was introduced through the anus and                        advanced to the the cecum, identified by appendiceal                        orifice and ileocecal valve. The quality of the bowel                        preparation was good. The colonoscopy was performed                        without difficulty. The patient tolerated the procedure                        well. Findings:      The colon (entire examined portion) appeared normal.      The retroflexed view of the distal rectum and anal verge was normal and       showed no anal or rectal abnormalities.      The digital rectal exam was normal. Impression:           - The entire examined colon is normal.                       -  The distal rectum and anal verge are normal on                        retroflexion view.                       - No  specimens collected. Recommendation:       - Soft diet today, then advance as tolerated to advance                        diet as tolerated. Procedure Code(s):    --- Professional ---                       [phone], Colonoscopy, flexible; diagnostic, including                        collection of specimen(s) by brushing or washing, when                        performed (separate procedure) Diagnosis Code(s):    --- Professional ---                       Z83.71, Family history of colonic polyps CPT copyright 2018 American [employer]. All rights reserved. The codes documented in this report are preliminary and upon coder review may  be revised to meet current compliance requirements. Christena Deem, MD 03/24/2018 10:59:36 AM This report has been signed electronically. Number of Addenda: 0 Note Initiated On: 03/24/2018 10:36 AM Scope Withdrawal Time: 0 hours 6 minutes 23 seconds  Total Procedure Duration: 0 hours 13 minutes 18 seconds       [facility]

## 2018-03-24 NOTE — Anesthesia Post-op Follow-up Note (Signed)
Anesthesia QCDR form completed.        

## 2018-03-24 NOTE — H&P (Signed)
Outpatient short stay form Pre-procedure 03/24/2018 10:19 AM Laura Gilmore Jason Frisbee MD  Primary Physician: Carren Ranganielle Carter, PA  Reason for visit: Colonoscopy  History of present illness:: Patient is a 52 year old female presenting today for colon cancer screening.  There is a family history of colon polyps in a primary relative, father.  This is patient's first colonoscopy.  She tolerated her prep well.  She takes 81 mg aspirin daily the last time yesterday.  She takes no other aspirin products or blood thinning agent.  Denies any problems with diarrhea abdominal pain or rectal bleeding.   No current facility-administered medications for this encounter.   Medications Prior to Admission  Medication Sig Dispense Refill Last Dose  . levothyroxine (SYNTHROID, LEVOTHROID) 25 MCG tablet Take 25 mcg by mouth daily before breakfast.     . aspirin EC 81 MG tablet Take 81 mg by mouth daily.    10/06/2017  . atorvastatin (LIPITOR) 10 MG tablet Take 10 mg by mouth every evening.   10/13/2017 at Unknown time  . ibuprofen (ADVIL,MOTRIN) 200 MG tablet Take 3 tablets (600 mg total) by mouth every 6 (six) hours as needed for cramping. 30 tablet 0   . naproxen sodium (ALEVE) 220 MG tablet Take 440 mg by mouth 2 (two) times daily as needed (for pain/headaches.).   Not Taking at Unknown time     No Known Allergies   Past Medical History:  Diagnosis Date  . GERD (gastroesophageal reflux disease)   . Hypercholesterolemia   . Hypothyroidism     Review of systems:      Physical Exam    Heart and lungs: Regular rate and rhythm without rub or gallop, lungs are bilaterally clear.    HEENT: Normocephalic atraumatic eyes are anicteric    Other:    Pertinant exam for procedure: Soft nontender nondistended bowel sounds positive normoactive.    Planned proceedures: Colonoscopy and indicated procedures. I have discussed the risks benefits and complications of procedures to include not limited to bleeding,  infection, perforation and the risk of sedation and the patient wishes to proceed.    Laura Gilmore Yuliya Nova, MD Gastroenterology 03/24/2018  10:19 AM

## 2018-03-24 NOTE — Transfer of Care (Signed)
Immediate Anesthesia Transfer of Care Note  Patient: Laura MessingMichelle B Wales  Procedure(s) Performed: COLONOSCOPY WITH PROPOFOL (N/A )  Patient Location: PACU and Endoscopy Unit  Anesthesia Type:General  Level of Consciousness: awake and drowsy  Airway & Oxygen Therapy: Patient Spontanous Breathing and Patient connected to nasal cannula oxygen  Post-op Assessment: Report given to RN and Post -op Vital signs reviewed and stable  Post vital signs: Reviewed and stable  Last Vitals:  Vitals Value Taken Time  BP 95/58 03/24/2018 11:02 AM  Temp 36.4 C 03/24/2018 11:01 AM  Pulse 78 03/24/2018 11:04 AM  Resp 24 03/24/2018 11:04 AM  SpO2 100 % 03/24/2018 11:04 AM  Vitals shown include unvalidated device data.  Last Pain:  Vitals:   03/24/18 1101  TempSrc: Tympanic  PainSc: Asleep         Complications: No apparent anesthesia complications

## 2018-03-24 NOTE — Anesthesia Preprocedure Evaluation (Signed)
Anesthesia Evaluation  Patient identified by MRN, date of birth, ID band Patient awake    Reviewed: Allergy & Precautions, H&P , NPO status , Patient's Chart, lab work & pertinent test results  History of Anesthesia Complications (+) PONV and history of anesthetic complications (PONV despite propofol TIVA with Decadron and Zofran for last anesthetic)  Airway Mallampati: I  TM Distance: >3 FB Neck ROM: full    Dental  (+) Teeth Intact   Pulmonary neg pulmonary ROS,           Cardiovascular negative cardio ROS       Neuro/Psych negative neurological ROS  negative psych ROS   GI/Hepatic Neg liver ROS, GERD  ,  Endo/Other  Hypothyroidism Morbid obesity  Renal/GU negative Renal ROS  negative genitourinary   Musculoskeletal   Abdominal   Peds  Hematology negative hematology ROS (+)   Anesthesia Other Findings Past Medical History: No date: GERD (gastroesophageal reflux disease) No date: Hypercholesterolemia No date: Hypothyroidism  Past Surgical History: 2006: CESAREAN SECTION 10/14/2017: HYSTEROSCOPY W/D&C; N/A     Comment:  Procedure: DILATATION AND CURETTAGE /HYSTEROSCOPY;                Surgeon: Ward, Elenora Fenderhelsea C, MD;  Location: [facility] ORS;                Service: Gynecology;  Laterality: N/A; 10/14/2017: INTRAUTERINE DEVICE (IUD) INSERTION; N/A     Comment:  Procedure: INTRAUTERINE DEVICE (IUD) INSERTION;                Surgeon: Ward, Elenora Fenderhelsea C, MD;  Location: [facility] ORS;                Service: Gynecology;  Laterality: N/A; 10/14/2017: POLYPECTOMY; N/A     Comment:  Procedure: POLYPECTOMY;  Surgeon: Ward, Elenora Fenderhelsea C, MD;                Location: [facility] ORS;  Service: Gynecology;  Laterality:               N/A; No date: REFRACTIVE SURGERY; Bilateral  BMI    Body Mass Index:  44.59 kg/m      Reproductive/Obstetrics negative OB ROS                             Anesthesia Physical Anesthesia  Plan  ASA: III  Anesthesia Plan: General   Post-op Pain Management:    Induction:   PONV Risk Score and Plan: Propofol infusion, TIVA, Scopolamine patch - Pre-op, Dexamethasone, Ondansetron and Promethazine  Airway Management Planned:   Additional Equipment:   Intra-op Plan:   Post-operative Plan:   Informed Consent: I have reviewed the patients History and Physical, chart, labs and discussed the procedure including the risks, benefits and alternatives for the proposed anesthesia with the patient or authorized representative who has indicated his/her understanding and acceptance.   Dental Advisory Given  Plan Discussed with: Anesthesiologist  Anesthesia Plan Comments:         Anesthesia Quick Evaluation

## 2018-03-27 NOTE — Anesthesia Postprocedure Evaluation (Signed)
Anesthesia Post Note  Patient: Laura Gilmore  Procedure(s) Performed: COLONOSCOPY WITH PROPOFOL (N/A )  Patient location during evaluation: PACU Anesthesia Type: General Level of consciousness: awake and alert Pain management: pain level controlled Vital Signs Assessment: post-procedure vital signs reviewed and stable Respiratory status: spontaneous breathing, nonlabored ventilation and respiratory function stable Cardiovascular status: blood pressure returned to baseline and stable Postop Assessment: no apparent nausea or vomiting Anesthetic complications: no     Last Vitals:  Vitals:   03/24/18 1101 03/24/18 1121  BP: (!) 95/58 102/66  Pulse:    Resp: 18 16  Temp: (!) 36.4 C   SpO2:      Last Pain:  Vitals:   03/24/18 1121  TempSrc:   PainSc: 0-No pain                 Jovita GammaKathryn L Fitzgerald

## 2018-03-28 ENCOUNTER — Encounter: Payer: Self-pay | Admitting: Gastroenterology

## 2018-08-08 ENCOUNTER — Other Ambulatory Visit: Payer: Self-pay | Admitting: Obstetrics and Gynecology

## 2018-08-08 DIAGNOSIS — Z1231 Encounter for screening mammogram for malignant neoplasm of breast: Secondary | ICD-10-CM

## 2018-10-09 ENCOUNTER — Other Ambulatory Visit: Payer: Self-pay

## 2018-10-09 ENCOUNTER — Ambulatory Visit
Admission: RE | Admit: 2018-10-09 | Discharge: 2018-10-09 | Disposition: A | Discharge status: Home / Self Care | Payer: 59 | Source: Ambulatory Visit | Attending: Obstetrics and Gynecology | Admitting: Obstetrics and Gynecology

## 2018-10-09 DIAGNOSIS — Z1231 Encounter for screening mammogram for malignant neoplasm of breast: Secondary | ICD-10-CM

## 2018-12-03 ENCOUNTER — Emergency Department
Admission: EM | Admit: 2018-12-03 | Discharge: 2018-12-03 | Disposition: A | Discharge status: Home / Self Care | Payer: 59 | Source: Home / Self Care | Attending: Emergency Medicine | Admitting: Emergency Medicine

## 2018-12-03 ENCOUNTER — Other Ambulatory Visit: Payer: Self-pay

## 2018-12-03 ENCOUNTER — Encounter: Payer: Self-pay | Admitting: Emergency Medicine

## 2018-12-03 DIAGNOSIS — Z20828 Contact with and (suspected) exposure to other viral communicable diseases: Secondary | ICD-10-CM | POA: Insufficient documentation

## 2018-12-03 DIAGNOSIS — E039 Hypothyroidism, unspecified: Secondary | ICD-10-CM | POA: Insufficient documentation

## 2018-12-03 DIAGNOSIS — Z20822 Contact with and (suspected) exposure to covid-19: Secondary | ICD-10-CM

## 2018-12-03 DIAGNOSIS — R110 Nausea: Secondary | ICD-10-CM | POA: Insufficient documentation

## 2018-12-03 DIAGNOSIS — R05 Cough: Secondary | ICD-10-CM | POA: Insufficient documentation

## 2018-12-03 MED ORDER — ONDANSETRON 4 MG PO TBDP: 4.0000 mg | ORAL_TABLET | Freq: Three times a day (TID) | ORAL | 1 refills | Status: DC | PRN

## 2018-12-03 NOTE — ED Provider Notes (Signed)
[facility] Emergency Department Provider Note  ____________________________________________  Time seen: Approximately 4:56 PM  I have reviewed the triage vital signs and the nursing notes.   HISTORY  Chief Complaint Fever and Nausea    HPI Laura Gilmore is a 53 y.o. female who presents the emergency department for evaluation of COVID-19-like symptoms.  Patient reports that she began with symptoms 3 days ago.  Patient left work, was tested at her primary care office and results are pending at this time.  Patient reports that she became concerned as her fever has risen to 103 F temp max.  Patient reports that the temperature always responds to Tylenol, but prior to timing of next dose her temperature returns.  Patient has not been taking any Motrin in addition to her Tylenol as she was unsure whether she could take both medications at the same time.  Patient has body aches, fevers and chills, scratchy throat, slight cough.  Patient denies any vision changes, neck pain or stiffness, chest pain, shortness of breath, abdominal pain, diarrhea or constipation.  Patient does have some mild nausea but no emesis at this time.  No other complaints at this time.  Patient is here for evaluation of why her fever continues to return while taking Tylenol.         Past Medical History:  Diagnosis Date  . GERD (gastroesophageal reflux disease)   . Hypercholesterolemia   . Hypothyroidism     Patient Active Problem List   Diagnosis Date Noted  . HLD (hyperlipidemia) 04/09/2015    Past Surgical History:  Procedure Laterality Date  . CESAREAN SECTION  2006  . COLONOSCOPY WITH PROPOFOL N/A 03/24/2018   Procedure: COLONOSCOPY WITH PROPOFOL;  Surgeon: Lollie Sails, MD;  Location: [facility] ENDOSCOPY;  Service: Endoscopy;  Laterality: N/A;  . HYSTEROSCOPY W/D&C N/A 10/14/2017   Procedure: DILATATION AND CURETTAGE /HYSTEROSCOPY;  Surgeon: Ward, Honor Loh, MD;  Location:  [facility] ORS;  Service: Gynecology;  Laterality: N/A;  . INTRAUTERINE DEVICE (IUD) INSERTION N/A 10/14/2017   Procedure: INTRAUTERINE DEVICE (IUD) INSERTION;  Surgeon: Ward, Honor Loh, MD;  Location: [facility] ORS;  Service: Gynecology;  Laterality: N/A;  . POLYPECTOMY N/A 10/14/2017   Procedure: POLYPECTOMY;  Surgeon: Ward, Honor Loh, MD;  Location: [facility] ORS;  Service: Gynecology;  Laterality: N/A;  . REFRACTIVE SURGERY Bilateral     Prior to Admission medications   Medication Sig Start Date End Date Taking? Authorizing Provider  aspirin EC 81 MG tablet Take 81 mg by mouth daily.     [provider]  atorvastatin (LIPITOR) 10 MG tablet Take 10 mg by mouth every evening.    [provider]  ibuprofen (ADVIL,MOTRIN) 200 MG tablet Take 3 tablets (600 mg total) by mouth every 6 (six) hours as needed for cramping. 10/14/17   Ward, Honor Loh, MD  levothyroxine (SYNTHROID, LEVOTHROID) 25 MCG tablet Take 25 mcg by mouth daily before breakfast.    [provider]  naproxen sodium (ALEVE) 220 MG tablet Take 440 mg by mouth 2 (two) times daily as needed (for pain/headaches.).    [provider]  ondansetron (ZOFRAN-ODT) 4 MG disintegrating tablet Take 1 tablet (4 mg total) by mouth every 8 (eight) hours as needed for nausea or vomiting. 12/03/18   , Charline Bills, PA-C    Allergies Patient has no known allergies.  Family History  Problem Relation Age of Onset  . Cancer Mother   . Heart attack Father   . Stroke Father   .  Diabetes Father   . Breast cancer Neg Hx     Social History Social History   Tobacco Use  . Smoking status: Never Smoker  . Smokeless tobacco: Never Used  Substance Use Topics  . Alcohol use: Not Currently    Alcohol/week: 0.0 standard drinks    Comment: rare  . Drug use: Never     Review of Systems  Constitutional: Positive fever/chills.  Positive for body aches Eyes: No visual changes. No discharge ENT: Scratchy  throat. Cardiovascular: no chest pain. Respiratory: Mild cough. No SOB. Gastrointestinal: No abdominal pain.  Positive for nausea, denies vomiting.  No diarrhea.  No constipation. Genitourinary: Negative for dysuria. No hematuria Musculoskeletal: Negative for musculoskeletal pain. Skin: Negative for rash, abrasions, lacerations, ecchymosis. Neurological: Negative for headaches, focal weakness or numbness. 10-point ROS otherwise negative.  ____________________________________________   PHYSICAL EXAM:  VITAL SIGNS: ED Triage Vitals  Enc Vitals Group     BP 12/03/18 1444 (!) 130/112     Pulse Rate 12/03/18 1444 98     Resp 12/03/18 1444 18     Temp 12/03/18 1444 99.9 F (37.7 C)     Temp Source 12/03/18 1444 Oral     SpO2 12/03/18 1444 95 %     Weight 12/03/18 1450 246 lb (111.6 kg)     Height 12/03/18 1450 5\' 1"  (1.549 m)     Head Circumference --      Peak Flow --      Pain Score 12/03/18 1450 5     Pain Loc --      Pain Edu? --      Excl. in GC? --      Constitutional: Alert and oriented.  Mildly ill-appearing but in no acute distress. Eyes: Conjunctivae are normal. PERRL. EOMI. Head: Atraumatic. ENT:      Ears: EACs and TMs unremarkable bilaterally.      Nose: No congestion/rhinnorhea.      Mouth/Throat: Mucous membranes are moist.  Neck: No stridor.  Neck is supple full range of motion Hematological/Lymphatic/Immunilogical: No cervical lymphadenopathy. Cardiovascular: Normal rate, regular rhythm. Normal S1 and S2.  Good peripheral circulation. Respiratory: Normal respiratory effort without tachypnea or retractions. Lungs CTAB. Good air entry to the bases with no decreased or absent breath sounds. Gastrointestinal: Bowel sounds 4 quadrants. Soft and nontender to palpation. No guarding or rigidity. No palpable masses. No distention. No CVA tenderness. Musculoskeletal: Full range of motion to all extremities. No gross deformities appreciated. Neurologic:  Normal  speech and language. No gross focal neurologic deficits are appreciated.  Skin:  Skin is warm, dry and intact. No rash noted. Psychiatric: Mood and affect are normal. Speech and behavior are normal. Patient exhibits appropriate insight and judgement.   ____________________________________________   LABS (all labs ordered are listed, but only abnormal results are displayed)  Labs Reviewed - No data to display ____________________________________________  EKG   ____________________________________________  RADIOLOGY   No results found.  ____________________________________________    PROCEDURES  Procedure(s) performed:    Procedures    Medications - No data to display   ____________________________________________   INITIAL IMPRESSION / ASSESSMENT AND PLAN / ED COURSE  Pertinent labs & imaging results that were available during my care of the patient were reviewed by me and considered in my medical decision making (see chart for details).  Review of the [place] CSRS was performed in accordance of the [facility] prior to dispensing any controlled drugs.         The patient  was evaluated for the symptoms described in the history of present illness. The patient was evaluated in the context of the global COVID-19 pandemic, which necessitated consideration that the patient might be at risk for infection with the SARS-CoV-2 virus that causes COVID-19. Institutional protocols and algorithms that pertain to the evaluation of patients at risk for COVID-19 are in a state of rapid change based on information released by regulatory bodies including the CDC and federal and state organizations. The most current policies and algorithms were followed during the patient's care in the ED.    Patient's diagnosis is consistent with COVID-19-like illness.  Patient presented to the emergency department with symptoms consistent with COVID-19.  Patient is already been evaluated primary care and has  a pending COVID-19 test.  Patient presents emergency department concern for fevers.  Patient has been taking Tylenol but states that prior to the next dosing her fever has returned.  She was encouraged to take Tylenol Motrin at the same time.  We had a lengthy discussion about antipyretic use and alternating Tylenol and Motrin.  Patient verbalizes understanding and will try same at home.  Patient is also complaining of nausea with decreased appetite and decreased fluid intake.  No evidence of dehydration on physical exam.  Patient will be prescribed Zofran for nausea.  Patient is given symptoms and signs to be concerned for in regards to COVID-19.  Patient verbalizes understanding of same.  No further work-up at this time..  Follow-up with primary care.  Patient is given ED precautions to return to the ED for any worsening or new symptoms.     ____________________________________________  FINAL CLINICAL IMPRESSION(S) / ED DIAGNOSES  Final diagnoses:  Suspected Covid-19 Virus Infection      NEW MEDICATIONS STARTED DURING THIS VISIT:  ED Discharge Orders         Ordered    ondansetron (ZOFRAN-ODT) 4 MG disintegrating tablet  Every 8 hours PRN     12/03/18 1716              This chart was dictated using voice recognition software/Dragon. Despite best efforts to proofread, errors can occur which can change the meaning. Any change was purely unintentional.    Racheal PatchesCuthriell,  D, PA-C 12/03/18 1717    Jene EveryKinner, Robert, MD 12/03/18 [id number]

## 2018-12-03 NOTE — ED Triage Notes (Addendum)
Pt arrived via POV with reports of fever, nausea, body aches, headache.   Pt reports fever started on Friday morning, pt states she left work and was went to [facility] to get a COVID test.  Pt has COVID test pending with [facility] at this time.  Pt reports fever off and on more at night states she takes 300mg  of Tylenol which brings the fever down, but then comes back several hours later. Pt last had tylenol at 1230pm today.   Pt told to quarantine for 14 days until COVID results back.

## 2018-12-05 ENCOUNTER — Emergency Department: Payer: 59

## 2018-12-05 ENCOUNTER — Other Ambulatory Visit: Payer: Self-pay

## 2018-12-05 ENCOUNTER — Inpatient Hospital Stay
Admission: EM | Admit: 2018-12-05 | Discharge: 2018-12-07 | DRG: 872 | Disposition: A | Discharge status: Home / Self Care | Payer: 59 | Attending: Internal Medicine | Admitting: Internal Medicine

## 2018-12-05 DIAGNOSIS — Z809 Family history of malignant neoplasm, unspecified: Secondary | ICD-10-CM

## 2018-12-05 DIAGNOSIS — Z79890 Hormone replacement therapy (postmenopausal): Secondary | ICD-10-CM | POA: Diagnosis not present

## 2018-12-05 DIAGNOSIS — E785 Hyperlipidemia, unspecified: Secondary | ICD-10-CM | POA: Diagnosis present

## 2018-12-05 DIAGNOSIS — K219 Gastro-esophageal reflux disease without esophagitis: Secondary | ICD-10-CM | POA: Diagnosis present

## 2018-12-05 DIAGNOSIS — N12 Tubulo-interstitial nephritis, not specified as acute or chronic: Secondary | ICD-10-CM

## 2018-12-05 DIAGNOSIS — E876 Hypokalemia: Secondary | ICD-10-CM | POA: Diagnosis present

## 2018-12-05 DIAGNOSIS — Z833 Family history of diabetes mellitus: Secondary | ICD-10-CM

## 2018-12-05 DIAGNOSIS — R079 Chest pain, unspecified: Secondary | ICD-10-CM

## 2018-12-05 DIAGNOSIS — E039 Hypothyroidism, unspecified: Secondary | ICD-10-CM | POA: Diagnosis present

## 2018-12-05 DIAGNOSIS — A419 Sepsis, unspecified organism: Secondary | ICD-10-CM

## 2018-12-05 DIAGNOSIS — N10 Acute tubulo-interstitial nephritis: Secondary | ICD-10-CM | POA: Diagnosis present

## 2018-12-05 DIAGNOSIS — Z7982 Long term (current) use of aspirin: Secondary | ICD-10-CM

## 2018-12-05 DIAGNOSIS — A4151 Sepsis due to Escherichia coli [E. coli]: Secondary | ICD-10-CM | POA: Diagnosis not present

## 2018-12-05 DIAGNOSIS — Z20828 Contact with and (suspected) exposure to other viral communicable diseases: Secondary | ICD-10-CM | POA: Diagnosis present

## 2018-12-05 DIAGNOSIS — E7800 Pure hypercholesterolemia, unspecified: Secondary | ICD-10-CM | POA: Diagnosis present

## 2018-12-05 DIAGNOSIS — Z823 Family history of stroke: Secondary | ICD-10-CM

## 2018-12-05 DIAGNOSIS — Z8249 Family history of ischemic heart disease and other diseases of the circulatory system: Secondary | ICD-10-CM

## 2018-12-05 DIAGNOSIS — R509 Fever, unspecified: Secondary | ICD-10-CM | POA: Diagnosis present

## 2018-12-05 DIAGNOSIS — Z7951 Long term (current) use of inhaled steroids: Secondary | ICD-10-CM

## 2018-12-05 LAB — CBC WITH DIFFERENTIAL/PLATELET
Abs Immature Granulocytes: 0.25 10*3/uL — ABNORMAL HIGH (ref 0.00–0.07)
Basophils Absolute: 0.1 10*3/uL (ref 0.0–0.1)
Basophils Relative: 0 %
Eosinophils Absolute: 0 10*3/uL (ref 0.0–0.5)
Eosinophils Relative: 0 %
HCT: 39.8 % (ref 36.0–46.0)
Hemoglobin: 13.3 g/dL (ref 12.0–15.0)
Immature Granulocytes: 1 %
Lymphocytes Relative: 4 %
Lymphs Abs: 0.8 10*3/uL (ref 0.7–4.0)
MCH: 28.2 pg (ref 26.0–34.0)
MCHC: 33.4 g/dL (ref 30.0–36.0)
MCV: 84.5 fL (ref 80.0–100.0)
Monocytes Absolute: 2.1 10*3/uL — ABNORMAL HIGH (ref 0.1–1.0)
Monocytes Relative: 11 %
Neutro Abs: 15.3 10*3/uL — ABNORMAL HIGH (ref 1.7–7.7)
Neutrophils Relative %: 84 %
Platelets: 233 10*3/uL (ref 150–400)
RBC: 4.71 MIL/uL (ref 3.87–5.11)
RDW: 13.8 % (ref 11.5–15.5)
WBC: 18.5 10*3/uL — ABNORMAL HIGH (ref 4.0–10.5)
nRBC: 0 % (ref 0.0–0.2)

## 2018-12-05 LAB — URINALYSIS, COMPLETE (UACMP) WITH MICROSCOPIC
Bilirubin Urine: NEGATIVE
Glucose, UA: NEGATIVE mg/dL
Ketones, ur: 5 mg/dL — AB
Nitrite: POSITIVE — AB
Protein, ur: 100 mg/dL — AB
RBC / HPF: >50 RBC/hpf — ABNORMAL HIGH (ref 0–5)
Specific Gravity, Urine: 1.032 — ABNORMAL HIGH (ref 1.005–1.030)
WBC, UA: >50 WBC/hpf — ABNORMAL HIGH (ref 0–5)
pH: 5 (ref 5.0–8.0)

## 2018-12-05 LAB — BLOOD CULTURE ID PANEL (REFLEXED)

## 2018-12-05 LAB — TROPONIN I (HIGH SENSITIVITY): Troponin I (High Sensitivity): 17 ng/L (ref <18)

## 2018-12-05 LAB — COMPREHENSIVE METABOLIC PANEL
ALT: 26 U/L (ref 0–44)
AST: 32 U/L (ref 15–41)
Albumin: 2.9 g/dL — ABNORMAL LOW (ref 3.5–5.0)
Alkaline Phosphatase: 135 U/L — ABNORMAL HIGH (ref 38–126)
Anion gap: 12 (ref 5–15)
BUN: 14 mg/dL (ref 6–20)
CO2: 20 mmol/L — ABNORMAL LOW (ref 22–32)
Calcium: 8.8 mg/dL — ABNORMAL LOW (ref 8.9–10.3)
Chloride: 103 mmol/L (ref 98–111)
Creatinine, Ser: 1.17 mg/dL — ABNORMAL HIGH (ref 0.44–1.00)
GFR calc Af Amer: >60 mL/min (ref >60)
GFR calc non Af Amer: 54 mL/min — ABNORMAL LOW (ref >60)
Glucose, Bld: 183 mg/dL — ABNORMAL HIGH (ref 70–99)
Potassium: 3.4 mmol/L — ABNORMAL LOW (ref 3.5–5.1)
Sodium: 135 mmol/L (ref 135–145)
Total Bilirubin: 1.3 mg/dL — ABNORMAL HIGH (ref 0.3–1.2)
Total Protein: 7.2 g/dL (ref 6.5–8.1)

## 2018-12-05 LAB — SARS CORONAVIRUS 2 BY RT PCR (HOSPITAL ORDER, PERFORMED IN ~~LOC~~ HOSPITAL LAB): SARS Coronavirus 2: NEGATIVE

## 2018-12-05 LAB — POCT PREGNANCY, URINE: Preg Test, Ur: NEGATIVE

## 2018-12-05 LAB — LACTIC ACID, PLASMA: Lactic Acid, Venous: 1.3 mmol/L (ref 0.5–1.9)

## 2018-12-05 MED ORDER — IBUPROFEN 600 MG PO TABS: 600.0000 mg | ORAL_TABLET | Freq: Four times a day (QID) | ORAL | Status: DC | PRN

## 2018-12-05 MED ORDER — ONDANSETRON HCL 4 MG/2ML IJ SOLN
4.0000 mg | Freq: Once | INTRAMUSCULAR | Status: AC
  Administered 2018-12-05: 4 mg via INTRAVENOUS
  Filled 2018-12-05: qty 2

## 2018-12-05 MED ORDER — ATORVASTATIN CALCIUM 10 MG PO TABS
10.0000 mg | ORAL_TABLET | Freq: Every evening | ORAL | Status: DC
  Administered 2018-12-05 – 2018-12-06 (×2): 10 mg via ORAL
  Filled 2018-12-05 (×2): qty 1

## 2018-12-05 MED ORDER — FLUTICASONE PROPIONATE 50 MCG/ACT NA SUSP
1.0000 | Freq: Two times a day (BID) | NASAL | Status: DC
  Filled 2018-12-05: qty 16

## 2018-12-05 MED ORDER — ALUM & MAG HYDROXIDE-SIMETH 200-200-20 MG/5ML PO SUSP
30.0000 mL | Freq: Once | ORAL | Status: AC
  Administered 2018-12-05: 30 mL via ORAL
  Filled 2018-12-05: qty 30

## 2018-12-05 MED ORDER — ONDANSETRON HCL 4 MG/2ML IJ SOLN
4.0000 mg | Freq: Four times a day (QID) | INTRAMUSCULAR | Status: DC | PRN
  Administered 2018-12-05: 4 mg via INTRAVENOUS
  Filled 2018-12-05: qty 2

## 2018-12-05 MED ORDER — SODIUM CHLORIDE 0.9 % IV SOLN
1.0000 g | Freq: Three times a day (TID) | INTRAVENOUS | Status: DC
  Administered 2018-12-05 – 2018-12-07 (×5): 1 g via INTRAVENOUS
  Filled 2018-12-05 (×7): qty 1

## 2018-12-05 MED ORDER — SODIUM CHLORIDE 0.9 % IV SOLN
INTRAVENOUS | Status: DC
  Administered 2018-12-05 – 2018-12-07 (×5): via INTRAVENOUS

## 2018-12-05 MED ORDER — ONDANSETRON HCL 4 MG PO TABS: 4.0000 mg | ORAL_TABLET | Freq: Four times a day (QID) | ORAL | Status: DC | PRN

## 2018-12-05 MED ORDER — LIDOCAINE VISCOUS HCL 2 % MT SOLN
15.0000 mL | Freq: Once | OROMUCOSAL | Status: AC
  Administered 2018-12-05: 15 mL via ORAL
  Filled 2018-12-05: qty 15

## 2018-12-05 MED ORDER — CEPHALEXIN 500 MG PO CAPS: 500.0000 mg | ORAL_CAPSULE | Freq: Three times a day (TID) | ORAL | 0 refills | Status: DC

## 2018-12-05 MED ORDER — IBUPROFEN 400 MG PO TABS
400.0000 mg | ORAL_TABLET | Freq: Four times a day (QID) | ORAL | Status: DC | PRN
  Administered 2018-12-05 – 2018-12-06 (×2): 400 mg via ORAL
  Filled 2018-12-05 (×2): qty 1

## 2018-12-05 MED ORDER — ACETAMINOPHEN 325 MG PO TABS
650.0000 mg | ORAL_TABLET | Freq: Four times a day (QID) | ORAL | Status: DC | PRN
  Administered 2018-12-05 – 2018-12-06 (×2): 650 mg via ORAL
  Filled 2018-12-05 (×2): qty 2

## 2018-12-05 MED ORDER — SODIUM CHLORIDE 0.9 % IV SOLN
1.0000 g | Freq: Once | INTRAVENOUS | Status: AC
  Administered 2018-12-05: 1 g via INTRAVENOUS
  Filled 2018-12-05: qty 10

## 2018-12-05 MED ORDER — FAMOTIDINE IN NACL 20-0.9 MG/50ML-% IV SOLN
20.0000 mg | Freq: Once | INTRAVENOUS | Status: AC
  Administered 2018-12-05: 20 mg via INTRAVENOUS
  Filled 2018-12-05: qty 50

## 2018-12-05 MED ORDER — ENOXAPARIN SODIUM 40 MG/0.4ML ~~LOC~~ SOLN
40.0000 mg | Freq: Two times a day (BID) | SUBCUTANEOUS | Status: DC
  Administered 2018-12-06: 40 mg via SUBCUTANEOUS
  Filled 2018-12-05: qty 0.4

## 2018-12-05 MED ORDER — SODIUM CHLORIDE 0.9 % IV SOLN
1.0000 g | INTRAVENOUS | Status: DC
  Filled 2018-12-05: qty 10

## 2018-12-05 MED ORDER — LEVOTHYROXINE SODIUM 50 MCG PO TABS
50.0000 ug | ORAL_TABLET | Freq: Every day | ORAL | Status: DC
  Administered 2018-12-06 – 2018-12-07 (×2): 50 ug via ORAL
  Filled 2018-12-05 (×2): qty 1

## 2018-12-05 MED ORDER — ASPIRIN EC 81 MG PO TBEC
81.0000 mg | DELAYED_RELEASE_TABLET | Freq: Every day | ORAL | Status: DC
  Administered 2018-12-05 – 2018-12-07 (×3): 81 mg via ORAL
  Filled 2018-12-05 (×3): qty 1

## 2018-12-05 MED ORDER — ENOXAPARIN SODIUM 40 MG/0.4ML ~~LOC~~ SOLN
40.0000 mg | SUBCUTANEOUS | Status: DC
  Administered 2018-12-05: 40 mg via SUBCUTANEOUS
  Filled 2018-12-05: qty 0.4

## 2018-12-05 MED ORDER — IOHEXOL 300 MG/ML  SOLN
100.0000 mL | Freq: Once | INTRAMUSCULAR | Status: AC | PRN
  Administered 2018-12-05: 100 mL via INTRAVENOUS

## 2018-12-05 MED ORDER — HYDROCODONE-ACETAMINOPHEN 5-325 MG PO TABS: 1.0000 | ORAL_TABLET | ORAL | Status: DC | PRN

## 2018-12-05 MED ORDER — SODIUM CHLORIDE 0.9 % IV BOLUS
1000.0000 mL | Freq: Once | INTRAVENOUS | Status: AC
  Administered 2018-12-05: 1000 mL via INTRAVENOUS

## 2018-12-05 MED ORDER — ONDANSETRON 4 MG PO TBDP: 4.0000 mg | ORAL_TABLET | Freq: Three times a day (TID) | ORAL | 0 refills | Status: DC | PRN

## 2018-12-05 MED ORDER — ACETAMINOPHEN 500 MG PO TABS
1000.0000 mg | ORAL_TABLET | Freq: Once | ORAL | Status: AC
  Administered 2018-12-05: 1000 mg via ORAL
  Filled 2018-12-05: qty 2

## 2018-12-05 NOTE — ED Provider Notes (Signed)
-----------------------------------------   7:27 AM on 12/05/2018 -----------------------------------------  Patient care assumed from Dr. Alfred Levins.  Patient's CT of the chest shows a right-sided pyelonephritis, urinalysis very consistent with urinary tract infection as well.  Corona test is negative.  Urine culture has been sent, IV antibiotics are infusing.  I discussed disposition with the patient.  States she continues to feel very poorly, very weak and continues to have chest pains.  Patient is tachypneic with a fever, elevated white blood cell count consistent with sepsis.  We will admit to the hospitalist service for continued monitoring and treatment.  Patient agreeable.   Harvest Dark, MD 12/05/18 0730

## 2018-12-05 NOTE — ED Notes (Signed)
Patient unhooked and assisted to use restroom. Ambulatory with steady gait and NAD noted.

## 2018-12-05 NOTE — ED Notes (Addendum)
Pt assisted into hosp gown & on card monitor; st since Friday having fever, chills, nausea accomp by midsternal CP radiating down into abd and back; st COVID swab on Friday was negative; seen here on Sunday and rx zofran; resp even/unlab, lungs clear, apical audible & regular, +BS, abd soft/nondist/nontender

## 2018-12-05 NOTE — ED Provider Notes (Signed)
[facility] Emergency Department Provider Note  ____________________________________________  Time seen: Approximately 5:58 AM  I have reviewed the triage vital signs and the nursing notes.   HISTORY  Chief Complaint Chest Pain, Fever, and Abdominal Pain   HPI Laura Gilmore is a 53 y.o. female with h/o GERD, HLD, and hypothyroidism who presents for evaluation of COVID-19-like symptoms.  Patient was swabbed for COVID 4 days ago and results came back negative.  She is complaining of 4 days of fever as high as 103F, nausea, vomiting, diarrhea.  Now she is having epigastric burning abdominal pain radiating to her chest.  No cough or shortness of breath.  No sore throat.  She has had daily moderate diffuse headaches.  She denies any known exposures to COVID.   Past Medical History:  Diagnosis Date   GERD (gastroesophageal reflux disease)    Hypercholesterolemia    Hypothyroidism     Patient Active Problem List   Diagnosis Date Noted   HLD (hyperlipidemia) 04/09/2015    Past Surgical History:  Procedure Laterality Date   CESAREAN SECTION  2006   COLONOSCOPY WITH PROPOFOL N/A 03/24/2018   Procedure: COLONOSCOPY WITH PROPOFOL;  Surgeon: Christena DeemSkulskie, Martin U, MD;  Location: [facility] END[facility]COPY;  Service: Endoscopy;  Laterality: N/A;   HYSTEROSCOPY W/D&C N/A 10/14/2017   Procedure: DILATATION AND CURETTAGE /HYSTEROSCOPY;  Surgeon: Ward, Elenora Fenderhelsea C, MD;  Location: [facility] ORS;  Service: Gynecology;  Laterality: N/A;   INTRAUTERINE DEVICE (IUD) INSERTION N/A 10/14/2017   Procedure: INTRAUTERINE DEVICE (IUD) INSERTION;  Surgeon: Ward, Elenora Fenderhelsea C, MD;  Location: [facility] ORS;  Service: Gynecology;  Laterality: N/A;   POLYPECTOMY N/A 10/14/2017   Procedure: POLYPECTOMY;  Surgeon: Ward, Elenora Fenderhelsea C, MD;  Location: [facility] ORS;  Service: Gynecology;  Laterality: N/A;   REFRACTIVE SURGERY Bilateral     Prior to Admission medications   Medication Sig Start Date End Date  Taking? Authorizing Provider  aspirin EC 81 MG tablet Take 81 mg by mouth daily.     [provider]  atorvastatin (LIPITOR) 10 MG tablet Take 10 mg by mouth every evening.    [provider]  cephALEXin (KEFLEX) 500 MG capsule Take 1 capsule (500 mg total) by mouth 3 (three) times daily for 7 days. 12/05/18 12/12/18  Nita SickleVeronese, [place], MD  ibuprofen (ADVIL,MOTRIN) 200 MG tablet Take 3 tablets (600 mg total) by mouth every 6 (six) hours as needed for cramping. 10/14/17   Ward, Elenora Fenderhelsea C, MD  levothyroxine (SYNTHROID, LEVOTHROID) 25 MCG tablet Take 25 mcg by mouth daily before breakfast.    [provider]  naproxen sodium (ALEVE) 220 MG tablet Take 440 mg by mouth 2 (two) times daily as needed (for pain/headaches.).    [provider]  ondansetron (ZOFRAN ODT) 4 MG disintegrating tablet Take 1 tablet (4 mg total) by mouth every 8 (eight) hours as needed. 12/05/18   Nita SickleVeronese, [place], MD    Allergies Patient has no known allergies.  Family History  Problem Relation Age of Onset   Cancer Mother    Heart attack Father    Stroke Father    Diabetes Father    Breast cancer Neg Hx     Social History Social History   Tobacco Use   Smoking status: Never Smoker   Smokeless tobacco: Never Used  Substance Use Topics   Alcohol use: Not Currently    Alcohol/week: 0.0 standard drinks    Comment: rare   Drug use: Never    Review of  Systems  Constitutional: + fever. Eyes: Negative for visual changes. ENT: Negative for sore throat. Neck: No neck pain  Cardiovascular: Negative for chest pain. Respiratory: Negative for shortness of breath. Gastrointestinal: + epigastric abdominal pain, vomiting and diarrhea. Genitourinary: Negative for dysuria. Musculoskeletal: Negative for back pain. Skin: Negative for rash. Neurological: Negative for weakness or numbness. + HA Psych: No SI or HI  ____________________________________________   PHYSICAL  EXAM:  VITAL SIGNS: ED Triage Vitals  Enc Vitals Group     BP 12/05/18 0219 97/60     Pulse Rate 12/05/18 0219 (!) 102     Resp 12/05/18 0219 (!) 25     Temp 12/05/18 0219 99.8 F (37.7 C)     Temp Source 12/05/18 0219 Oral     SpO2 12/05/18 0219 94 %     Weight 12/05/18 0215 237 lb (107.5 kg)     Height 12/05/18 0215 5\' 1"  (1.549 m)     Head Circumference --      Peak Flow --      Pain Score 12/05/18 0214 8     Pain Loc --      Pain Edu? --      Excl. in GC? --     Constitutional: Alert and oriented. Well appearing and in no apparent distress. HEENT:      Head: Normocephalic and atraumatic.         Eyes: Conjunctivae are normal. Sclera is non-icteric.       Mouth/Throat: Mucous membranes are moist.       Neck: Supple with no signs of meningismus. Cardiovascular: Tachycardic with regular rhythm. No murmurs, gallops, or rubs. 2+ symmetrical distal pulses are present in all extremities. No JVD. Respiratory: Tachypneic, no hypoxia. Lungs are clear to auscultation bilaterally. No wheezes, crackles, or rhonchi.  Gastrointestinal: Soft, diffusely tender to palpation, and non distended with positive bowel sounds. No rebound or guarding. Musculoskeletal: Nontender with normal range of motion in all extremities. No edema, cyanosis, or erythema of extremities. Neurologic: Normal speech and language. Face is symmetric. Moving all extremities. No gross focal neurologic deficits are appreciated. Skin: Skin is warm, dry and intact. No rash noted. Psychiatric: Mood and affect are normal. Speech and behavior are normal.  ____________________________________________   LABS (all labs ordered are listed, but only abnormal results are displayed)  Labs Reviewed  CBC WITH DIFFERENTIAL/PLATELET - Abnormal; Notable for the following components:      Result Value   WBC 18.5 (*)    Neutro Abs 15.3 (*)    Monocytes Absolute 2.1 (*)    Abs Immature Granulocytes 0.25 (*)    All other components  within normal limits  COMPREHENSIVE METABOLIC PANEL - Abnormal; Notable for the following components:   Potassium 3.4 (*)    CO2 20 (*)    Glucose, Bld 183 (*)    Creatinine, Ser 1.17 (*)    Calcium 8.8 (*)    Albumin 2.9 (*)    Alkaline Phosphatase 135 (*)    Total Bilirubin 1.3 (*)    GFR calc non Af Amer 54 (*)    All other components within normal limits  SARS CORONAVIRUS 2 (HOSPITAL ORDER, PERFORMED IN [facility])  CULTURE, BLOOD (ROUTINE X 2)  CULTURE, BLOOD (ROUTINE X 2)  URINE CULTURE  LACTIC ACID, PLASMA  URINALYSIS, COMPLETE (UACMP) WITH MICROSCOPIC  POC URINE PREG, ED  POCT PREGNANCY, URINE  TROPONIN I (HIGH SENSITIVITY)   ____________________________________________  EKG  ED ECG REPORT I, [place]  Alfred Levins, the attending physician, personally viewed and interpreted this ECG.  Sinus tachycardia, rate of 104, normal intervals, normal axis, no ST elevations or depressions, T wave inversion anterior leads, low voltage QRS.  No significant changes when compared to prior. ____________________________________________  RADIOLOGY  I have personally reviewed the images performed during this visit and I agree with the Radiologist's read.   Interpretation by Radiologist:  Ct Chest W Contrast  Result Date: 12/05/2018 CLINICAL DATA:  Unspecified abdominal pain.  Leukocytosis. EXAM: CT CHEST, ABDOMEN, AND PELVIS WITH CONTRAST TECHNIQUE: Multidetector CT imaging of the chest, abdomen and pelvis was performed following the standard protocol during bolus administration of intravenous contrast. CONTRAST:  120mL OMNIPAQUE IOHEXOL 300 MG/ML  SOLN COMPARISON:  Chest CT 07/26/2017 FINDINGS: CT CHEST FINDINGS Cardiovascular: Normal heart size. No pericardial effusion. No acute vascular finding. Mediastinum/Nodes: Negative for adenopathy or mass. Lungs/Pleura: There is no edema, consolidation, effusion, or pneumothorax. Musculoskeletal: Mild thoracic spondylosis. CT ABDOMEN  PELVIS FINDINGS Hepatobiliary: Hepatic steatosis.No evidence of biliary obstruction or stone. Pancreas: Unremarkable. Spleen: Unremarkable. Adrenals/Urinary Tract: Negative adrenals. Patchy hypoenhancement of the right kidney which is expanded. No rim enhancing collection or hydronephrosis. There is leukocytosis. Urinalysis is pending. Nursing notes report fever chills and back pain. No urolithiasis. Unremarkable bladder. Stomach/Bowel:  No obstruction. No appendicitis. Vascular/Lymphatic: No acute vascular abnormality. No mass or adenopathy. Reproductive:No pathologic findings.  IUD in expected position Other: No ascites or pneumoperitoneum. Musculoskeletal: Degenerative facet spurring and spondylosis. IMPRESSION: 1. Right pyelonephritis without abscess or hydronephrosis. 2. Negative chest. Electronically Signed   By: Monte Fantasia M.D.   On: 12/05/2018 06:42   Ct Abdomen Pelvis W Contrast  Result Date: 12/05/2018 CLINICAL DATA:  Unspecified abdominal pain.  Leukocytosis. EXAM: CT CHEST, ABDOMEN, AND PELVIS WITH CONTRAST TECHNIQUE: Multidetector CT imaging of the chest, abdomen and pelvis was performed following the standard protocol during bolus administration of intravenous contrast. CONTRAST:  171mL OMNIPAQUE IOHEXOL 300 MG/ML  SOLN COMPARISON:  Chest CT 07/26/2017 FINDINGS: CT CHEST FINDINGS Cardiovascular: Normal heart size. No pericardial effusion. No acute vascular finding. Mediastinum/Nodes: Negative for adenopathy or mass. Lungs/Pleura: There is no edema, consolidation, effusion, or pneumothorax. Musculoskeletal: Mild thoracic spondylosis. CT ABDOMEN PELVIS FINDINGS Hepatobiliary: Hepatic steatosis.No evidence of biliary obstruction or stone. Pancreas: Unremarkable. Spleen: Unremarkable. Adrenals/Urinary Tract: Negative adrenals. Patchy hypoenhancement of the right kidney which is expanded. No rim enhancing collection or hydronephrosis. There is leukocytosis. Urinalysis is pending. Nursing notes  report fever chills and back pain. No urolithiasis. Unremarkable bladder. Stomach/Bowel:  No obstruction. No appendicitis. Vascular/Lymphatic: No acute vascular abnormality. No mass or adenopathy. Reproductive:No pathologic findings.  IUD in expected position Other: No ascites or pneumoperitoneum. Musculoskeletal: Degenerative facet spurring and spondylosis. IMPRESSION: 1. Right pyelonephritis without abscess or hydronephrosis. 2. Negative chest. Electronically Signed   By: Monte Fantasia M.D.   On: 12/05/2018 06:42   Dg Chest Port 1 View  Result Date: 12/05/2018 CLINICAL DATA:  Fever and chills. EXAM: PORTABLE CHEST 1 VIEW COMPARISON:  Chest CT 07/26/2017 FINDINGS: Borderline heart size. Negative aortic contours. There is no edema, consolidation, effusion, or pneumothorax. IMPRESSION: No evidence of pneumonia. Electronically Signed   By: Monte Fantasia M.D.   On: 12/05/2018 05:14     ____________________________________________   PROCEDURES  Procedure(s) performed: None Procedures Critical Care performed:  Yes  CRITICAL CARE Performed by: Rudene Re  ?  Total critical care time: 35 min  Critical care time was exclusive of separately billable procedures and treating other patients.  Critical care was  necessary to treat or prevent imminent or life-threatening deterioration.  Critical care was time spent personally by me on the following activities: development of treatment plan with patient and/or surrogate as well as nursing, discussions with consultants, evaluation of patient's response to treatment, examination of patient, obtaining history from patient or surrogate, ordering and performing treatments and interventions, ordering and review of laboratory studies, ordering and review of radiographic studies, pulse oximetry and re-evaluation of patient's condition.  ____________________________________________   INITIAL IMPRESSION / ASSESSMENT AND PLAN / ED COURSE   53 y.o.  female with h/o GERD, HLD, and hypothyroidism who presents for evaluation of COVID-19-like symptoms x 4 days, now with epigastric burning pain radiating to the chest. Negative COVID swab on day 1 of symptoms. Patient has slightly increased respiratory rate but no hypoxia, lungs are clear to auscultation.  She has a low-grade fever with a pulse of 102.  BP is soft at 97/60.  Abdomen is diffusely tender to palpation with no rebound or guarding.  Patient reports several daily episodes of vomiting and diarrhea.  Differential diagnosis including COVID versus viral gastroenteritis versus pneumonia versus appendicitis.  EKG and troponin with no signs of myocarditis.  Labs showing leukocytosis with white count of 18.5, mild bump in the creatinine most likely due to dehydration.  Chest x-ray negative for pneumonia.  Will test patient for COVID again.  Send her for CT chest abdomen pelvis.  Will give IV fluids and Zofran.  Will supplement electrolytes as needed.    _________________________ 7:09 AM on 12/05/2018 -----------------------------------------  CT concerning for pyelonephritis.  Patient continues to deny any urinary symptoms.  No evidence of kidney stone.  With elevated white count, fever, mild tachycardia on arrival patient does meet sepsis criteria.  Patient does not wish to be admitted to the hospital.  Lactic is normal.  Will send blood cultures, urine culture, start patient on Rocephin and plan to discharge home on Keflex.  Discussed very strict return precautions with close follow-up with PCP but also return to the emergency room if she still having fevers after 24 hours, flank pain, worsening vomiting.  Her COVID swab is still pending.  Care transferred to Dr. Lenard LancePaduchowski.    As part of my medical decision making, I reviewed the following data within the electronic MEDICAL RECORD NUMBER Nursing notes reviewed and incorporated, Labs reviewed , EKG interpreted , Old chart reviewed, Radiograph reviewed ,  Notes from prior ED visits and [place] Controlled Substance Database   Patient was evaluated in Emergency Department today for the symptoms described in the history of present illness. Patient was evaluated in the context of the global COVID-19 pandemic, which necessitated consideration that the patient might be at risk for infection with the SARS-CoV-2 virus that causes COVID-19. Institutional protocols and algorithms that pertain to the evaluation of patients at risk for COVID-19 are in a state of rapid change based on information released by regulatory bodies including the CDC and federal and state organizations. These policies and algorithms were followed during the patient's care in the ED.   ____________________________________________   FINAL CLINICAL IMPRESSION(S) / ED DIAGNOSES   Final diagnoses:  Pyelonephritis      NEW MEDICATIONS STARTED DURING THIS VISIT:  ED Discharge Orders         Ordered    cephALEXin (KEFLEX) 500 MG capsule  3 times daily     12/05/18 0712    ondansetron (ZOFRAN ODT) 4 MG disintegrating tablet  Every 8 hours PRN  12/05/18 3086           Note:  This document was prepared using Dragon voice recognition software and may include unintentional dictation errors.    Don Perking, [place], MD 12/05/18 [phone]

## 2018-12-05 NOTE — Progress Notes (Signed)
Pharmacy Antibiotic Note  Laura Gilmore is a 53 y.o. female admitted on 12/05/2018 with bacteremia (E Coli in 1 of 4 bottles).  Pharmacy has been consulted for Meropenem dosing.  Plan: Meropenem 1 gm IV Q8H to start on 7/28.   Height: 5\' 1"  (154.9 cm) Weight: 237 lb (107.5 kg) IBW/kg (Calculated) : 47.8  Temp (24hrs), Avg:101 F (38.3 C), Min:99.3 F (37.4 C), Max:103.2 F (39.6 C)  Recent Labs  Lab 12/05/18 0228  WBC 18.5*  CREATININE 1.17*  LATICACIDVEN 1.3    Estimated Creatinine Clearance: 64.4 mL/min (A) (by C-G formula based on SCr of 1.17 mg/dL (H)).    No Known Allergies  Antimicrobials this admission:   >>    >>   Dose adjustments this admission:   Microbiology results:  BCx:   UCx:    Sputum:    MRSA PCR:   Thank you for allowing pharmacy to be a part of this patient's care.  Zaleah Ternes D 12/05/2018 8:31 PM

## 2018-12-05 NOTE — H&P (Signed)
[facility] - [place] at [facility]   PATIENT NAME: Laura Gilmore    MR#:  [id number]  DATE OF BIRTH:  02/02/1966  DATE OF ADMISSION:  12/05/2018  PRIMARY CARE PHYSICIAN: [facility]   REQUESTING/REFERRING PHYSICIAN: Minna AntisPaduchowski Kevin MD  CHIEF COMPLAINT:   Chief Complaint  Patient presents with  . Chest Pain  . Fever  . Abdominal Pain    HISTORY OF PRESENT ILLNESS: Laura Gilmore  is a 53 y.o. female with a known history of GERD, hypercholesteremia, hypothyroidism who is presenting to the emergency room with complaint of abdominal pain and high-grade fevers.  She states that the symptoms started few days ago and has progressively gotten worse.  She states that she is having abdominal pain in the lower abdomen with radiation to her back.  She denies any urinary symptoms.  She also complains of pain in her lower chest region.  In the emergency room she is noted to have UTI.  CT scan of the chest suggests right-sided pyelonephritis.  Patient denies any nausea vomiting or diarrhea.   PAST MEDICAL HISTORY:   Past Medical History:  Diagnosis Date  . GERD (gastroesophageal reflux disease)   . Hypercholesterolemia   . Hypothyroidism     PAST SURGICAL HISTORY:  Past Surgical History:  Procedure Laterality Date  . CESAREAN SECTION  2006  . COLONOSCOPY WITH PROPOFOL N/A 03/24/2018   Procedure: COLONOSCOPY WITH PROPOFOL;  Surgeon: Christena DeemSkulskie, Martin U, MD;  [facility];  Service: Endoscopy;  Laterality: N/A;  . HYSTEROSCOPY W/D&C N/A 10/14/2017   Procedure: DILATATION AND CURETTAGE /HYSTEROSCOPY;  Surgeon: Ward, Elenora Fenderhelsea C, MD;  Location: [facility] ORS;  Service: Gynecology;  Laterality: N/A;  . INTRAUTERINE DEVICE (IUD) INSERTION N/A 10/14/2017   Procedure: INTRAUTERINE DEVICE (IUD) INSERTION;  Surgeon: Ward, Elenora Fenderhelsea C, MD;  Location: [facility] ORS;  Service: Gynecology;  Laterality: N/A;  . POLYPECTOMY N/A 10/14/2017   Procedure: POLYPECTOMY;  Surgeon:  Ward, Elenora Fenderhelsea C, MD;  Location: [facility] ORS;  Service: Gynecology;  Laterality: N/A;  . REFRACTIVE SURGERY Bilateral     SOCIAL HISTORY:  Social History   Tobacco Use  . Smoking status: Never Smoker  . Smokeless tobacco: Never Used  Substance Use Topics  . Alcohol use: Not Currently    Alcohol/week: 0.0 standard drinks    Comment: rare    FAMILY HISTORY:  Family History  Problem Relation Age of Onset  . Cancer Mother   . Heart attack Father   . Stroke Father   . Diabetes Father   . Breast cancer Neg Hx     DRUG ALLERGIES: No Known Allergies  REVIEW OF SYSTEMS:   CONSTITUTIONAL: Positive fever, fatigue or weakness.  EYES: No blurred or double vision.  EARS, NOSE, AND THROAT: No tinnitus or ear pain.  RESPIRATORY: No cough, shortness of breath, wheezing or hemoptysis.  CARDIOVASCULAR: No chest pain, orthopnea, edema.  GASTROINTESTINAL: No nausea, vomiting, diarrhea or positive lower abdominal pain.  GENITOURINARY: No dysuria, hematuria.  ENDOCRINE: No polyuria, nocturia,  HEMATOLOGY: No anemia, easy bruising or bleeding SKIN: No rash or lesion. MUSCULOSKELETAL: No joint pain or arthritis.   NEUROLOGIC: No tingling, numbness, weakness.  PSYCHIATRY: No anxiety or depression.   MEDICATIONS AT HOME:  Prior to Admission medications   Medication Sig Start Date End Date Taking? Authorizing Provider  acetaminophen (TYLENOL) 325 MG tablet Take 650 mg by mouth every 6 (six) hours as needed.   Yes [provider]  aspirin EC 81 MG tablet Take 81 mg  by mouth daily.    Yes [provider]  atorvastatin (LIPITOR) 10 MG tablet Take 10 mg by mouth every evening.   Yes [provider]  cephALEXin (KEFLEX) 500 MG capsule Take 500 mg by mouth 4 (four) times daily.   Yes [provider]  ibuprofen (ADVIL,MOTRIN) 200 MG tablet Take 3 tablets (600 mg total) by mouth every 6 (six) hours as needed for cramping. 10/14/17  Yes Ward, Honor Loh, MD  levothyroxine  (SYNTHROID) 50 MCG tablet Take 50 mcg by mouth daily. Take one tablet by mouth on an empty stomach with a glass of water at least 30-60 minutes before breakfast 09/05/18  Yes [provider]  cephALEXin (KEFLEX) 500 MG capsule Take 1 capsule (500 mg total) by mouth 3 (three) times daily for 7 days. 12/05/18 12/12/18  Rudene Re, MD  fluticasone [facility]) 50 MCG/ACT nasal spray Place 1 spray into both nostrils 2 (two) times a day. 06/12/18   [provider]  levonorgestrel (LILETTA) 19.5 MCG/DAY IUD IUD 1 Intra Uterine Device by Intrauterine route once.    [provider]  naproxen sodium (ALEVE) 220 MG tablet Take 440 mg by mouth 2 (two) times daily as needed (for pain/headaches.).    [provider]  ondansetron (ZOFRAN ODT) 4 MG disintegrating tablet Take 1 tablet (4 mg total) by mouth every 8 (eight) hours as needed. 12/05/18   Rudene Re, MD      PHYSICAL EXAMINATION:   VITAL SIGNS: Blood pressure 118/70, pulse 96, temperature 99.3 F (37.4 C), temperature source Oral, resp. rate 17, height 5\' 1"  (1.549 m), weight 107.5 kg, SpO2 96 %.  GENERAL:  53 y.o.-year-old patient lying in the bed with no acute distress.  EYES: Pupils equal, round, reactive to light and accommodation. No scleral icterus. Extraocular muscles intact.  HEENT: Head atraumatic, normocephalic. Oropharynx and nasopharynx clear.  NECK:  Supple, no jugular venous distention. No thyroid enlargement, no tenderness.  LUNGS: Normal breath sounds bilaterally, no wheezing, rales,rhonchi or crepitation. No use of accessory muscles of respiration.  CARDIOVASCULAR: S1, S2 normal. No murmurs, rubs, or gallops.  ABDOMEN: Soft, nontender, nondistended. Bowel sounds present. No organomegaly or mass.  Right-sided flank tenderness EXTREMITIES: No pedal edema, cyanosis, or clubbing.  NEUROLOGIC: Cranial nerves II through XII are intact. Muscle strength 5/5 in all extremities. Sensation intact. Gait  not checked.  PSYCHIATRIC: The patient is alert and oriented x 3.  SKIN: No obvious rash, lesion, or ulcer.   LABORATORY PANEL:   CBC Recent Labs  Lab 12/05/18 0228  WBC 18.5*  HGB 13.3  HCT 39.8  PLT 233  MCV 84.5  MCH 28.2  MCHC 33.4  RDW 13.8  LYMPHSABS 0.8  MONOABS 2.1*  EOSABS 0.0  BASOSABS 0.1   ------------------------------------------------------------------------------------------------------------------  Chemistries  Recent Labs  Lab 12/05/18 0228  NA 135  K 3.4*  CL 103  CO2 20*  GLUCOSE 183*  BUN 14  CREATININE 1.17*  CALCIUM 8.8*  AST 32  ALT 26  ALKPHOS 135*  BILITOT 1.3*   ------------------------------------------------------------------------------------------------------------------ estimated creatinine clearance is 64.4 mL/min (A) (by C-G formula based on SCr of 1.17 mg/dL (H)). ------------------------------------------------------------------------------------------------------------------ No results for input(s): TSH, T4TOTAL, T3FREE, THYROIDAB in the last 72 hours.  Invalid input(s): FREET3   Coagulation profile No results for input(s): INR, PROTIME in the last 168 hours. ------------------------------------------------------------------------------------------------------------------- No results for input(s): DDIMER in the last 72 hours. -------------------------------------------------------------------------------------------------------------------  Cardiac Enzymes No results for input(s): CKMB, TROPONINI, MYOGLOBIN in the last 168  hours.  Invalid input(s): CK ------------------------------------------------------------------------------------------------------------------ Invalid input(s): POCBNP  ---------------------------------------------------------------------------------------------------------------  Urinalysis    Component Value Date/Time   COLORURINE AMBER (A) 12/05/2018 0648   APPEARANCEUR CLOUDY (A)  12/05/2018 0648   LABSPEC 1.032 (H) 12/05/2018 0648   PHURINE 5.0 12/05/2018 0648   GLUCOSEU NEGATIVE 12/05/2018 0648   HGBUR MODERATE (A) 12/05/2018 0648   BILIRUBINUR NEGATIVE 12/05/2018 0648   KETONESUR 5 (A) 12/05/2018 0648   PROTEINUR 100 (A) 12/05/2018 0648   NITRITE POSITIVE (A) 12/05/2018 0648   LEUKOCYTESUR MODERATE (A) 12/05/2018 0648     RADIOLOGY: Ct Chest W Contrast  Result Date: 12/05/2018 CLINICAL DATA:  Unspecified abdominal pain.  Leukocytosis. EXAM: CT CHEST, ABDOMEN, AND PELVIS WITH CONTRAST TECHNIQUE: Multidetector CT imaging of the chest, abdomen and pelvis was performed following the standard protocol during bolus administration of intravenous contrast. CONTRAST:  100mL OMNIPAQUE IOHEXOL 300 MG/ML  SOLN COMPARISON:  Chest CT 07/26/2017 FINDINGS: CT CHEST FINDINGS Cardiovascular: Normal heart size. No pericardial effusion. No acute vascular finding. Mediastinum/Nodes: Negative for adenopathy or mass. Lungs/Pleura: There is no edema, consolidation, effusion, or pneumothorax. Musculoskeletal: Mild thoracic spondylosis. CT ABDOMEN PELVIS FINDINGS Hepatobiliary: Hepatic steatosis.No evidence of biliary obstruction or stone. Pancreas: Unremarkable. Spleen: Unremarkable. Adrenals/Urinary Tract: Negative adrenals. Patchy hypoenhancement of the right kidney which is expanded. No rim enhancing collection or hydronephrosis. There is leukocytosis. Urinalysis is pending. Nursing notes report fever chills and back pain. No urolithiasis. Unremarkable bladder. Stomach/Bowel:  No obstruction. No appendicitis. Vascular/Lymphatic: No acute vascular abnormality. No mass or adenopathy. Reproductive:No pathologic findings.  IUD in expected position Other: No ascites or pneumoperitoneum. Musculoskeletal: Degenerative facet spurring and spondylosis. IMPRESSION: 1. Right pyelonephritis without abscess or hydronephrosis. 2. Negative chest. Electronically Signed   By: Marnee SpringJonathon  Watts M.D.   On: 12/05/2018  06:42   Ct Abdomen Pelvis W Contrast  Result Date: 12/05/2018 CLINICAL DATA:  Unspecified abdominal pain.  Leukocytosis. EXAM: CT CHEST, ABDOMEN, AND PELVIS WITH CONTRAST TECHNIQUE: Multidetector CT imaging of the chest, abdomen and pelvis was performed following the standard protocol during bolus administration of intravenous contrast. CONTRAST:  100mL OMNIPAQUE IOHEXOL 300 MG/ML  SOLN COMPARISON:  Chest CT 07/26/2017 FINDINGS: CT CHEST FINDINGS Cardiovascular: Normal heart size. No pericardial effusion. No acute vascular finding. Mediastinum/Nodes: Negative for adenopathy or mass. Lungs/Pleura: There is no edema, consolidation, effusion, or pneumothorax. Musculoskeletal: Mild thoracic spondylosis. CT ABDOMEN PELVIS FINDINGS Hepatobiliary: Hepatic steatosis.No evidence of biliary obstruction or stone. Pancreas: Unremarkable. Spleen: Unremarkable. Adrenals/Urinary Tract: Negative adrenals. Patchy hypoenhancement of the right kidney which is expanded. No rim enhancing collection or hydronephrosis. There is leukocytosis. Urinalysis is pending. Nursing notes report fever chills and back pain. No urolithiasis. Unremarkable bladder. Stomach/Bowel:  No obstruction. No appendicitis. Vascular/Lymphatic: No acute vascular abnormality. No mass or adenopathy. Reproductive:No pathologic findings.  IUD in expected position Other: No ascites or pneumoperitoneum. Musculoskeletal: Degenerative facet spurring and spondylosis. IMPRESSION: 1. Right pyelonephritis without abscess or hydronephrosis. 2. Negative chest. Electronically Signed   By: Marnee SpringJonathon  Watts M.D.   On: 12/05/2018 06:42   Dg Chest Port 1 View  Result Date: 12/05/2018 CLINICAL DATA:  Fever and chills. EXAM: PORTABLE CHEST 1 VIEW COMPARISON:  Chest CT 07/26/2017 FINDINGS: Borderline heart size. Negative aortic contours. There is no edema, consolidation, effusion, or pneumothorax. IMPRESSION: No evidence of pneumonia. Electronically Signed   By: Marnee SpringJonathon  Watts  M.D.   On: 12/05/2018 05:14    EKG: Orders placed or performed during the hospital encounter of 12/05/18  . EKG 12-Lead  . EKG  12-Lead  . ED EKG  . ED EKG    IMPRESSION AND PLAN: Patient is 53 year old with GERD hypercholesterolemia and hypothyroidism presenting with fever and abdominal pain  1.  Right-sided pyelonephritis we will treat with IV ceftriaxone follow urine cultures follow blood cultures  2.  Sepsis due to pyelonephritis  3.  Hyperlipidemia Lipitor  4.  Hypothyroidism continue Synthroid  5.  Miscellaneous Lovenox for DVT prophylaxis   All the records are reviewed and case discussed with ED provider. Management plans discussed with the patient, family and they are in agreement.  CODE STATUS:    Code Status Orders  (From admission, onward)         Start     Ordered   12/05/18 1301  Full code  Continuous     12/05/18 1300        Code Status History    This patient has a current code status but no historical code status.   Advance Care Planning Activity       TOTAL TIME TAKING CARE OF THIS PATIENT: 55 minutes.    Auburn BilberryShreyang Sadie Hazelett M.D on 12/05/2018 at 3:32 PM  Between 7am to 6pm - Pager - [phone]  After 6pm go to [email or web address] - Social research officer, governmentpassword EPAS [facility]  [facility] Office  [phone]  CC: Primary care physician; [facility]

## 2018-12-05 NOTE — Consult Note (Signed)
CODE SEPSIS - PHARMACY COMMUNICATION  **Broad Spectrum Antibiotics should be administered within 1 hour of Sepsis diagnosis**  Time Code Sepsis Called/Page Received: 4142  Antibiotics Ordered: Ceftriaxone  Time of 1st antibiotic administration: 0755  Additional action taken by pharmacy: none  If necessary, Name of Provider/Nurse Contacted: none  Lu Duffel, PharmD, BCPS Clinical Pharmacist 12/05/2018 8:01 AM

## 2018-12-05 NOTE — ED Notes (Signed)
Pt to CT via stretcher accomp by CT tech 

## 2018-12-05 NOTE — ED Notes (Signed)
ED TO INPATIENT HANDOFF REPORT  ED Nurse Name and Phone #: Ladaija Dimino [phone]  S Name/Age/Gender Laura MessingMichelle B Dunlap 53 y.o. female Room/Bed: ED02A/ED02A  Code Status   Code Status: Full Code  Home/SNF/Other Home Patient oriented to: self, place, time and situation Is this baseline? Yes   Triage Complete: Triage complete  Chief Complaint fever, chills, chest pain, abd pain  Triage Note Patient c/o fever, chills, chest pain radiating to back and abdomen, N/V/D. Patient has been tested for COVID, was informed of negative results today. Patient reports she was swabbed at walk-in clinic with a throat swab.   Allergies No Known Allergies  Level of Care/Admitting Diagnosis ED Disposition    ED Disposition Condition Comment   Admit  Hospital Area: [facility] [[id number]]  Level of Care: Med-Surg [16]  Covid Evaluation: Confirmed COVID Negative  Diagnosis: Acute pyelonephritis [[id number]][[id number]]  Admitting Physician: Auburn BilberryPATEL, SHREYANG [[id number]]  Attending Physician: Auburn BilberryPATEL, SHREYANG [[id number]]  Estimated length of stay: past midnight tomorrow  Certification:: I certify this patient will need inpatient services for at least 2 midnights  PT Class (Do Not Modify): Inpatient [101]  PT Acc Code (Do Not Modify): Private [1]       B Medical/Surgery History Past Medical History:  Diagnosis Date  . GERD (gastroesophageal reflux disease)   . Hypercholesterolemia   . Hypothyroidism    Past Surgical History:  Procedure Laterality Date  . CESAREAN SECTION  2006  . COLONOSCOPY WITH PROPOFOL N/A 03/24/2018   Procedure: COLONOSCOPY WITH PROPOFOL;  Surgeon: Christena DeemSkulskie, Martin U, MD;  Location[facility] ENDOSCOPY;  Service: Endoscopy;  Laterality: N/A;  . HYSTEROSCOPY W/D&C N/A 10/14/2017   Procedure: DILATATION AND CURETTAGE /HYSTEROSCOPY;  Surgeon: Ward, Elenora Fenderhelsea C, MD;  Location: [facility] ORS;  Service: Gynecology;  Laterality: N/A;  . INTRAUTERINE DEVICE (IUD) INSERTION N/A 10/14/2017    Procedure: INTRAUTERINE DEVICE (IUD) INSERTION;  Surgeon: Ward, Elenora Fenderhelsea C, MD;  Location: [facility] ORS;  Service: Gynecology;  Laterality: N/A;  . POLYPECTOMY N/A 10/14/2017   Procedure: POLYPECTOMY;  Surgeon: Ward, Elenora Fenderhelsea C, MD;  Location: [facility] ORS;  Service: Gynecology;  Laterality: N/A;  . REFRACTIVE SURGERY Bilateral      A IV Location/Drains/Wounds Patient Lines/Drains/Airways Status   Active Line/Drains/Airways    Name:   Placement date:   Placement time:   Site:   Days:   Peripheral IV 12/05/18 Left Forearm   12/05/18    0608    Forearm   less than 1          Intake/Output Last 24 hours  Intake/Output Summary (Last 24 hours) at 12/05/2018 1408 Last data filed at 12/05/2018 0825 Gross per 24 hour  Intake 1150 ml  Output -  Net 1150 ml    Labs/Imaging Results for orders placed or performed during the hospital encounter of 12/05/18 (from the past 48 hour(s))  Troponin I (High Sensitivity)     Status: None   Collection Time: 12/05/18  2:28 AM  Result Value Ref Range   Troponin I (High Sensitivity) 17 <18 ng/L    Comment: (NOTE) Elevated high sensitivity troponin I (hsTnI) values and significant  changes across serial measurements may suggest ACS but many other  chronic and acute conditions are known to elevate hsTnI results.  Refer to the "Links" section for chest pain algorithms and additional  guidance. Performed at [facility], [address] [place], [place] [address]   CBC with Differential     Status: Abnormal   Collection Time: 12/05/18  2:28 AM  Result Value Ref Range   WBC 18.5 (H) 4.0 - 10.5 K/uL   RBC 4.71 3.87 - 5.11 MIL/uL   Hemoglobin 13.3 12.0 - 15.0 g/dL   HCT 16.1 09.6 - 04.5 %   MCV 84.5 80.0 - 100.0 fL   MCH 28.2 26.0 - 34.0 pg   MCHC 33.4 30.0 - 36.0 g/dL   RDW 40.9 81.1 - 91.4 %   Platelets 233 150 - 400 K/uL   nRBC 0.0 0.0 - 0.2 %   Neutrophils Relative % 84 %   Neutro Abs 15.3 (H) 1.7 - 7.7 K/uL   Lymphocytes Relative 4 %    Lymphs Abs 0.8 0.7 - 4.0 K/uL   Monocytes Relative 11 %   Monocytes Absolute 2.1 (H) 0.1 - 1.0 K/uL   Eosinophils Relative 0 %   Eosinophils Absolute 0.0 0.0 - 0.5 K/uL   Basophils Relative 0 %   Basophils Absolute 0.1 0.0 - 0.1 K/uL   Immature Granulocytes 1 %   Abs Immature Granulocytes 0.25 (H) 0.00 - 0.07 K/uL    Comment: Performed at [facility], [address] [place], [place] [address]  Lactic acid, plasma     Status: None   Collection Time: 12/05/18  2:28 AM  Result Value Ref Range   Lactic Acid, Venous 1.3 0.5 - 1.9 mmol/L    Comment: Performed at [facility], [address] [place], [place] [address]  Comprehensive metabolic panel     Status: Abnormal   Collection Time: 12/05/18  2:28 AM  Result Value Ref Range   Sodium 135 135 - 145 mmol/L   Potassium 3.4 (L) 3.5 - 5.1 mmol/L   Chloride 103 98 - 111 mmol/L   CO2 20 (L) 22 - 32 mmol/L   Glucose, Bld 183 (H) 70 - 99 mg/dL   BUN 14 6 - 20 mg/dL   Creatinine, Ser 8.65 (H) 0.44 - 1.00 mg/dL   Calcium 8.8 (L) 8.9 - 10.3 mg/dL   Total Protein 7.2 6.5 - 8.1 g/dL   Albumin 2.9 (L) 3.5 - 5.0 g/dL   AST 32 15 - 41 U/L   ALT 26 0 - 44 U/L   Alkaline Phosphatase 135 (H) 38 - 126 U/L   Total Bilirubin 1.3 (H) 0.3 - 1.2 mg/dL   GFR calc non Af Amer 54 (L) >60 mL/min   GFR calc Af Amer >60 >60 mL/min   Anion gap 12 5 - 15    Comment: Performed at [facility], [address] [place], [place] [address]  SARS Coronavirus 2 (CEPHEID- Performed in [facility] hospital lab), Hosp Order     Status: None   Collection Time: 12/05/18  6:00 AM   Specimen: Nasopharyngeal Swab  Result Value Ref Range   SARS Coronavirus 2 NEGATIVE NEGATIVE    Comment: (NOTE) If result is NEGATIVE SARS-CoV-2 target nucleic acids are NOT DETECTED. The SARS-CoV-2 RNA is generally detectable in upper and lower  respiratory specimens during the acute phase of infection. The lowest  concentration of SARS-CoV-2 viral copies  this assay can detect is 250  copies / mL. A negative result does not preclude SARS-CoV-2 infection  and should not be used as the sole basis for treatment or other  patient management decisions.  A negative result may occur with  improper specimen collection / handling, submission of specimen other  than nasopharyngeal swab, presence of viral mutation(s) within the  areas targeted by this assay, and inadequate  number of viral copies  (<250 copies / mL). A negative result must be combined with clinical  observations, patient history, and epidemiological information. If result is POSITIVE SARS-CoV-2 target nucleic acids are DETECTED. The SARS-CoV-2 RNA is generally detectable in upper and lower  respiratory specimens dur ing the acute phase of infection.  Positive  results are indicative of active infection with SARS-CoV-2.  Clinical  correlation with patient history and other diagnostic information is  necessary to determine patient infection status.  Positive results do  not rule out bacterial infection or co-infection with other viruses. If result is PRESUMPTIVE POSTIVE SARS-CoV-2 nucleic acids MAY BE PRESENT.   A presumptive positive result was obtained on the submitted specimen  and confirmed on repeat testing.  While 2019 novel coronavirus  (SARS-CoV-2) nucleic acids may be present in the submitted sample  additional confirmatory testing may be necessary for epidemiological  and / or clinical management purposes  to differentiate between  SARS-CoV-2 and other Sarbecovirus currently known to infect humans.  If clinically indicated additional testing with an alternate test  methodology [phone]) is advised. The SARS-CoV-2 RNA is generally  detectable in upper and lower respiratory sp ecimens during the acute  phase of infection. The expected result is Negative. Fact Sheet for Patients:  StrictlyIdeas.no Fact Sheet for Healthcare  Providers: BankingDealers.co.za This test is not yet approved or cleared by the [place] FDA and has been authorized for detection and/or diagnosis of SARS-CoV-2 by FDA under an Emergency Use Authorization (EUA).  This EUA will remain in effect (meaning this test can be used) for the duration of the COVID-19 declaration under Section 564(b)(1) of the Act, 21 U.S.C. section 360bbb-3(b)(1), unless the authorization is terminated or revoked sooner. Performed at [facility], [address] [place], [place] [address]   Urinalysis, Complete w Microscopic     Status: Abnormal   Collection Time: 12/05/18  6:48 AM  Result Value Ref Range   Color, Urine AMBER (A) YELLOW    Comment: BIOCHEMICALS MAY BE AFFECTED BY COLOR   APPearance CLOUDY (A) CLEAR   Specific Gravity, Urine 1.032 (H) 1.005 - 1.030   pH 5.0 5.0 - 8.0   Glucose, UA NEGATIVE NEGATIVE mg/dL   Hgb urine dipstick MODERATE (A) NEGATIVE   Bilirubin Urine NEGATIVE NEGATIVE   Ketones, ur 5 (A) NEGATIVE mg/dL   Protein, ur 100 (A) NEGATIVE mg/dL   Nitrite POSITIVE (A) NEGATIVE   Leukocytes,Ua MODERATE (A) NEGATIVE   RBC / HPF >50 (H) 0 - 5 RBC/hpf   WBC, UA >50 (H) 0 - 5 WBC/hpf   Bacteria, UA MANY (A) NONE SEEN   Squamous Epithelial / LPF 0-5 0 - 5   Granular Casts, UA PRESENT     Comment: Performed at [facility], [address] [place], [place] [address]  Pregnancy, urine POC     Status: None   Collection Time: 12/05/18  6:50 AM  Result Value Ref Range   Preg Test, Ur NEGATIVE NEGATIVE    Comment:        THE SENSITIVITY OF THIS METHODOLOGY IS >24 mIU/mL    Ct Chest W Contrast  Result Date: 12/05/2018 CLINICAL DATA:  Unspecified abdominal pain.  Leukocytosis. EXAM: CT CHEST, ABDOMEN, AND PELVIS WITH CONTRAST TECHNIQUE: Multidetector CT imaging of the chest, abdomen and pelvis was performed following the standard protocol during bolus administration of intravenous contrast.  CONTRAST:  169mL OMNIPAQUE IOHEXOL 300 MG/ML  SOLN COMPARISON:  Chest CT 07/26/2017 FINDINGS: CT  CHEST FINDINGS Cardiovascular: Normal heart size. No pericardial effusion. No acute vascular finding. Mediastinum/Nodes: Negative for adenopathy or mass. Lungs/Pleura: There is no edema, consolidation, effusion, or pneumothorax. Musculoskeletal: Mild thoracic spondylosis. CT ABDOMEN PELVIS FINDINGS Hepatobiliary: Hepatic steatosis.No evidence of biliary obstruction or stone. Pancreas: Unremarkable. Spleen: Unremarkable. Adrenals/Urinary Tract: Negative adrenals. Patchy hypoenhancement of the right kidney which is expanded. No rim enhancing collection or hydronephrosis. There is leukocytosis. Urinalysis is pending. Nursing notes report fever chills and back pain. No urolithiasis. Unremarkable bladder. Stomach/Bowel:  No obstruction. No appendicitis. Vascular/Lymphatic: No acute vascular abnormality. No mass or adenopathy. Reproductive:No pathologic findings.  IUD in expected position Other: No ascites or pneumoperitoneum. Musculoskeletal: Degenerative facet spurring and spondylosis. IMPRESSION: 1. Right pyelonephritis without abscess or hydronephrosis. 2. Negative chest. Electronically Signed   By: Marnee SpringJonathon  Watts M.D.   On: 12/05/2018 06:42   Ct Abdomen Pelvis W Contrast  Result Date: 12/05/2018 CLINICAL DATA:  Unspecified abdominal pain.  Leukocytosis. EXAM: CT CHEST, ABDOMEN, AND PELVIS WITH CONTRAST TECHNIQUE: Multidetector CT imaging of the chest, abdomen and pelvis was performed following the standard protocol during bolus administration of intravenous contrast. CONTRAST:  100mL OMNIPAQUE IOHEXOL 300 MG/ML  SOLN COMPARISON:  Chest CT 07/26/2017 FINDINGS: CT CHEST FINDINGS Cardiovascular: Normal heart size. No pericardial effusion. No acute vascular finding. Mediastinum/Nodes: Negative for adenopathy or mass. Lungs/Pleura: There is no edema, consolidation, effusion, or pneumothorax. Musculoskeletal: Mild thoracic  spondylosis. CT ABDOMEN PELVIS FINDINGS Hepatobiliary: Hepatic steatosis.No evidence of biliary obstruction or stone. Pancreas: Unremarkable. Spleen: Unremarkable. Adrenals/Urinary Tract: Negative adrenals. Patchy hypoenhancement of the right kidney which is expanded. No rim enhancing collection or hydronephrosis. There is leukocytosis. Urinalysis is pending. Nursing notes report fever chills and back pain. No urolithiasis. Unremarkable bladder. Stomach/Bowel:  No obstruction. No appendicitis. Vascular/Lymphatic: No acute vascular abnormality. No mass or adenopathy. Reproductive:No pathologic findings.  IUD in expected position Other: No ascites or pneumoperitoneum. Musculoskeletal: Degenerative facet spurring and spondylosis. IMPRESSION: 1. Right pyelonephritis without abscess or hydronephrosis. 2. Negative chest. Electronically Signed   By: Marnee SpringJonathon  Watts M.D.   On: 12/05/2018 06:42   Dg Chest Port 1 View  Result Date: 12/05/2018 CLINICAL DATA:  Fever and chills. EXAM: PORTABLE CHEST 1 VIEW COMPARISON:  Chest CT 07/26/2017 FINDINGS: Borderline heart size. Negative aortic contours. There is no edema, consolidation, effusion, or pneumothorax. IMPRESSION: No evidence of pneumonia. Electronically Signed   By: Marnee SpringJonathon  Watts M.D.   On: 12/05/2018 05:14    Pending Labs Unresulted Labs (From admission, onward)    Start     Ordered   12/12/18 0500  Creatinine, serum  (enoxaparin (LOVENOX)    CrCl >/= 30 ml/min)  Weekly,   STAT    Comments: while on enoxaparin therapy    12/05/18 1300   12/06/18 0500  CBC  Tomorrow morning,   STAT     12/05/18 1300   12/06/18 0500  Basic metabolic panel  Tomorrow morning,   STAT     12/05/18 1300   12/05/18 0708  Urine Culture  Add-on,   AD     12/05/18 0707   12/05/18 0707  Blood culture (routine x 2)  BLOOD CULTURE X 2,   STAT     12/05/18 0707          Vitals/Pain Today's Vitals   12/05/18 1149 12/05/18 1200 12/05/18 1234 12/05/18 1330  BP: 139/80 140/70   (!) 108/59  Pulse: (!) 107   (!) 109  Resp:  (!) 30  (!) 27  Temp:   Marland Kitchen(!)  103.1 F (39.5 C)   TempSrc:   Oral   SpO2: 100%   96%  Weight:      Height:      PainSc:        Isolation Precautions No active isolations  Medications Medications  acetaminophen (TYLENOL) tablet 650 mg (650 mg Oral Given 12/05/18 1146)  aspirin EC tablet 81 mg (81 mg Oral Given 12/05/18 1329)  atorvastatin (LIPITOR) tablet 10 mg (has no administration in time range)  levothyroxine (SYNTHROID) tablet 50 mcg (50 mcg Oral Not Given 12/05/18 1301)  fluticasone (FLONASE) 50 MCG/ACT nasal spray 1 spray (1 spray Each Nare Refused 12/05/18 1334)  enoxaparin (LOVENOX) injection 40 mg (40 mg Subcutaneous Given 12/05/18 1332)  0.9 %  sodium chloride infusion ( Intravenous New Bag/Given 12/05/18 1339)  HYDROcodone-acetaminophen (NORCO/VICODIN) 5-325 MG per tablet 1-2 tablet (has no administration in time range)  ondansetron (ZOFRAN) tablet 4 mg ( Oral See Alternative 12/05/18 1145)    Or  ondansetron (ZOFRAN) injection 4 mg (4 mg Intravenous Given 12/05/18 1145)  cefTRIAXone (ROCEPHIN) 1 g in sodium chloride 0.9 % 100 mL IVPB (has no administration in time range)  ibuprofen (ADVIL) tablet 400 mg (400 mg Oral Given 12/05/18 1330)  sodium chloride 0.9 % bolus 1,000 mL (0 mLs Intravenous Stopped 12/05/18 0755)  acetaminophen (TYLENOL) tablet 1,000 mg (1,000 mg Oral Given 12/05/18 0612)  ondansetron (ZOFRAN) injection 4 mg (4 mg Intravenous Given 12/05/18 0610)  famotidine (PEPCID) IVPB 20 mg premix (0 mg Intravenous Stopped 12/05/18 0640)  alum & mag hydroxide-simeth (MAALOX/MYLANTA) 200-200-20 MG/5ML suspension 30 mL (30 mLs Oral Given 12/05/18 0612)    And  lidocaine (XYLOCAINE) 2 % viscous mouth solution 15 mL (15 mLs Oral Given 12/05/18 0613)  iohexol (OMNIPAQUE) 300 MG/ML solution 100 mL (100 mLs Intravenous Contrast Given 12/05/18 0627)  cefTRIAXone (ROCEPHIN) 1 g in sodium chloride 0.9 % 100 mL IVPB (0 g Intravenous Stopped  12/05/18 0825)    Mobility walks Low fall risk   Focused Assessments patient with abdominal pain and N/V x 2 days   R Recommendations: See Admitting Provider Note  Report given to:   Additional Notes:

## 2018-12-05 NOTE — Progress Notes (Signed)
PHARMACY - PHYSICIAN COMMUNICATION CRITICAL VALUE ALERT - BLOOD CULTURE IDENTIFICATION (BCID)  Results for orders placed or performed during the hospital encounter of 12/05/18  Blood Culture ID Panel (Reflexed) (Collected: 12/05/2018  7:57 AM)  Result Value Ref Range   Enterococcus species NOT DETECTED NOT DETECTED   Listeria monocytogenes NOT DETECTED NOT DETECTED   Staphylococcus species NOT DETECTED NOT DETECTED   Staphylococcus aureus (BCID) NOT DETECTED NOT DETECTED   Streptococcus species NOT DETECTED NOT DETECTED   Streptococcus agalactiae NOT DETECTED NOT DETECTED   Streptococcus pneumoniae NOT DETECTED NOT DETECTED   Streptococcus pyogenes NOT DETECTED NOT DETECTED   Acinetobacter baumannii NOT DETECTED NOT DETECTED   Enterobacteriaceae species DETECTED (A) NOT DETECTED   Enterobacter cloacae complex NOT DETECTED NOT DETECTED   Escherichia coli DETECTED (A) NOT DETECTED   Klebsiella oxytoca NOT DETECTED NOT DETECTED   Klebsiella pneumoniae NOT DETECTED NOT DETECTED   Proteus species NOT DETECTED NOT DETECTED   Serratia marcescens NOT DETECTED NOT DETECTED   Carbapenem resistance NOT DETECTED NOT DETECTED   Haemophilus influenzae NOT DETECTED NOT DETECTED   Neisseria meningitidis NOT DETECTED NOT DETECTED   Pseudomonas aeruginosa NOT DETECTED NOT DETECTED   Candida albicans NOT DETECTED NOT DETECTED   Candida glabrata NOT DETECTED NOT DETECTED   Candida krusei NOT DETECTED NOT DETECTED   Candida parapsilosis NOT DETECTED NOT DETECTED   Candida tropicalis NOT DETECTED NOT DETECTED    Name of physician (or Provider) Contacted:  Gouru    Changes to prescribed antibiotics required:   Yes, will d/c ceftriaxone and start meropenem.  Vihan Santagata D 12/05/2018  8:32 PM

## 2018-12-05 NOTE — ED Triage Notes (Addendum)
Patient c/o fever, chills, chest pain radiating to back and abdomen, N/V/D. Patient has been tested for COVID, was informed of negative results today. Patient reports she was swabbed at walk-in clinic with a throat swab.

## 2018-12-06 ENCOUNTER — Inpatient Hospital Stay: Payer: 59

## 2018-12-06 LAB — BASIC METABOLIC PANEL
Anion gap: 9 (ref 5–15)
BUN: 13 mg/dL (ref 6–20)
CO2: 22 mmol/L (ref 22–32)
Calcium: 8.2 mg/dL — ABNORMAL LOW (ref 8.9–10.3)
Chloride: 107 mmol/L (ref 98–111)
Creatinine, Ser: 1.08 mg/dL — ABNORMAL HIGH (ref 0.44–1.00)
GFR calc Af Amer: >60 mL/min (ref >60)
GFR calc non Af Amer: 59 mL/min — ABNORMAL LOW (ref >60)
Glucose, Bld: 148 mg/dL — ABNORMAL HIGH (ref 70–99)
Potassium: 3.3 mmol/L — ABNORMAL LOW (ref 3.5–5.1)
Sodium: 138 mmol/L (ref 135–145)

## 2018-12-06 LAB — CBC
HCT: 32.3 % — ABNORMAL LOW (ref 36.0–46.0)
Hemoglobin: 10.6 g/dL — ABNORMAL LOW (ref 12.0–15.0)
MCH: 28.3 pg (ref 26.0–34.0)
MCHC: 32.8 g/dL (ref 30.0–36.0)
MCV: 86.4 fL (ref 80.0–100.0)
Platelets: 224 10*3/uL (ref 150–400)
RBC: 3.74 MIL/uL — ABNORMAL LOW (ref 3.87–5.11)
RDW: 14.6 % (ref 11.5–15.5)
WBC: 19.2 10*3/uL — ABNORMAL HIGH (ref 4.0–10.5)
nRBC: 0 % (ref 0.0–0.2)

## 2018-12-06 MED ORDER — POTASSIUM CHLORIDE CRYS ER 20 MEQ PO TBCR
40.0000 meq | EXTENDED_RELEASE_TABLET | Freq: Once | ORAL | Status: AC
  Administered 2018-12-06: 40 meq via ORAL
  Filled 2018-12-06: qty 2

## 2018-12-06 NOTE — Progress Notes (Addendum)
[facility] NAME: Laura Gilmore    MR#:  [id number]  DATE OF BIRTH:  July 02, 1965  SUBJECTIVE:  CHIEF COMPLAINT:   Chief Complaint  Patient presents with  . Chest Pain  . Fever  . Abdominal Pain  Patient seen and evaluated today Decreased flank pain Had an episode of fever this morning No complaints of chest pain, shortness of breath Has okay appetite  REVIEW OF SYSTEMS:    ROS  CONSTITUTIONAL: No documented fever. Has fatigue, weakness. No weight gain, no weight loss.  EYES: No blurry or double vision.  ENT: No tinnitus. No postnasal drip. No redness of the oropharynx.  RESPIRATORY: No cough, no wheeze, no hemoptysis. No dyspnea.  CARDIOVASCULAR: No chest pain. No orthopnea. No palpitations. No syncope.  GASTROINTESTINAL: No nausea, no vomiting or diarrhea. No abdominal pain. No melena or hematochezia.  GENITOURINARY: No dysuria or hematuria.  Decreased flank pain. ENDOCRINE: No polyuria or nocturia. No heat or cold intolerance.  HEMATOLOGY: No anemia. No bruising. No bleeding.  INTEGUMENTARY: No rashes. No lesions.  MUSCULOSKELETAL: No arthritis. No swelling. No gout.  NEUROLOGIC: No numbness, tingling, or ataxia. No seizure-type activity.  PSYCHIATRIC: No anxiety. No insomnia. No ADD.   DRUG ALLERGIES:  No Known Allergies  VITALS:  Blood pressure (!) 103/55, pulse 90, temperature 99.4 F (37.4 C), temperature source Oral, resp. rate 16, height 5\' 1"  (1.549 m), weight 107.5 kg, SpO2 97 %.  PHYSICAL EXAMINATION:   Physical Exam  GENERAL:  53 y.o.-year-old patient lying in the bed with no acute distress.  EYES: Pupils equal, round, reactive to light and accommodation. No scleral icterus. Extraocular muscles intact.  HEENT: Head atraumatic, normocephalic. Oropharynx and nasopharynx clear.  NECK:  Supple, no jugular venous distention. No thyroid enlargement, no tenderness.  LUNGS: Normal breath sounds bilaterally,  no wheezing, rales, rhonchi. No use of accessory muscles of respiration.  CARDIOVASCULAR: S1, S2 normal. No murmurs, rubs, or gallops.  ABDOMEN: Soft, nontender, nondistended. Bowel sounds present. No organomegaly or mass. Decreased cva angle tenderness. EXTREMITIES: No cyanosis, clubbing or edema b/l.    NEUROLOGIC: Cranial nerves II through XII are intact. No focal Motor or sensory deficits b/l.   PSYCHIATRIC: The patient is alert and oriented x 3.  SKIN: No obvious rash, lesion, or ulcer.   LABORATORY PANEL:   CBC Recent Labs  Lab 12/06/18 0634  WBC 19.2*  HGB 10.6*  HCT 32.3*  PLT 224   ------------------------------------------------------------------------------------------------------------------ Chemistries  Recent Labs  Lab 12/05/18 0228 12/06/18 0634  NA 135 138  K 3.4* 3.3*  CL 103 107  CO2 20* 22  GLUCOSE 183* 148*  BUN 14 13  CREATININE 1.17* 1.08*  CALCIUM 8.8* 8.2*  AST 32  --   ALT 26  --   ALKPHOS 135*  --   BILITOT 1.3*  --    ------------------------------------------------------------------------------------------------------------------  Cardiac Enzymes No results for input(s): TROPONINI in the last 168 hours. ------------------------------------------------------------------------------------------------------------------  RADIOLOGY:  Ct Chest W Contrast  Result Date: 12/05/2018 CLINICAL DATA:  Unspecified abdominal pain.  Leukocytosis. EXAM: CT CHEST, ABDOMEN, AND PELVIS WITH CONTRAST TECHNIQUE: Multidetector CT imaging of the chest, abdomen and pelvis was performed following the standard protocol during bolus administration of intravenous contrast. CONTRAST:  157mL OMNIPAQUE IOHEXOL 300 MG/ML  SOLN COMPARISON:  Chest CT 07/26/2017 FINDINGS: CT CHEST FINDINGS Cardiovascular: Normal heart size. No pericardial effusion. No acute vascular finding. Mediastinum/Nodes: Negative for adenopathy or mass. Lungs/Pleura: There is no edema,  consolidation,  effusion, or pneumothorax. Musculoskeletal: Mild thoracic spondylosis. CT ABDOMEN PELVIS FINDINGS Hepatobiliary: Hepatic steatosis.No evidence of biliary obstruction or stone. Pancreas: Unremarkable. Spleen: Unremarkable. Adrenals/Urinary Tract: Negative adrenals. Patchy hypoenhancement of the right kidney which is expanded. No rim enhancing collection or hydronephrosis. There is leukocytosis. Urinalysis is pending. Nursing notes report fever chills and back pain. No urolithiasis. Unremarkable bladder. Stomach/Bowel:  No obstruction. No appendicitis. Vascular/Lymphatic: No acute vascular abnormality. No mass or adenopathy. Reproductive:No pathologic findings.  IUD in expected position Other: No ascites or pneumoperitoneum. Musculoskeletal: Degenerative facet spurring and spondylosis. IMPRESSION: 1. Right pyelonephritis without abscess or hydronephrosis. 2. Negative chest. Electronically Signed   By: Marnee SpringJonathon  Watts M.D.   On: 12/05/2018 06:42   Ct Abdomen Pelvis W Contrast  Result Date: 12/05/2018 CLINICAL DATA:  Unspecified abdominal pain.  Leukocytosis. EXAM: CT CHEST, ABDOMEN, AND PELVIS WITH CONTRAST TECHNIQUE: Multidetector CT imaging of the chest, abdomen and pelvis was performed following the standard protocol during bolus administration of intravenous contrast. CONTRAST:  100mL OMNIPAQUE IOHEXOL 300 MG/ML  SOLN COMPARISON:  Chest CT 07/26/2017 FINDINGS: CT CHEST FINDINGS Cardiovascular: Normal heart size. No pericardial effusion. No acute vascular finding. Mediastinum/Nodes: Negative for adenopathy or mass. Lungs/Pleura: There is no edema, consolidation, effusion, or pneumothorax. Musculoskeletal: Mild thoracic spondylosis. CT ABDOMEN PELVIS FINDINGS Hepatobiliary: Hepatic steatosis.No evidence of biliary obstruction or stone. Pancreas: Unremarkable. Spleen: Unremarkable. Adrenals/Urinary Tract: Negative adrenals. Patchy hypoenhancement of the right kidney which is expanded. No rim enhancing collection  or hydronephrosis. There is leukocytosis. Urinalysis is pending. Nursing notes report fever chills and back pain. No urolithiasis. Unremarkable bladder. Stomach/Bowel:  No obstruction. No appendicitis. Vascular/Lymphatic: No acute vascular abnormality. No mass or adenopathy. Reproductive:No pathologic findings.  IUD in expected position Other: No ascites or pneumoperitoneum. Musculoskeletal: Degenerative facet spurring and spondylosis. IMPRESSION: 1. Right pyelonephritis without abscess or hydronephrosis. 2. Negative chest. Electronically Signed   By: Marnee SpringJonathon  Watts M.D.   On: 12/05/2018 06:42   Dg Chest Port 1 View  Result Date: 12/05/2018 CLINICAL DATA:  Fever and chills. EXAM: PORTABLE CHEST 1 VIEW COMPARISON:  Chest CT 07/26/2017 FINDINGS: Borderline heart size. Negative aortic contours. There is no edema, consolidation, effusion, or pneumothorax. IMPRESSION: No evidence of pneumonia. Electronically Signed   By: Marnee SpringJonathon  Watts M.D.   On: 12/05/2018 05:14     ASSESSMENT AND PLAN:   53 year old female patient with history of GERD, hyperlipidemia, hypothyroidism currently under hospitalist service for abdominal discomfort and fever  -Acute right-sided pyelonephritis Imaging studies showed no abscess or any hydronephrosis Check renal ultrasound Discontinue Rocephin Start patient on broad-spectrum antibiotic IV meropenem Follow-up cultures  -Sepsis secondary to pyelonephritis IV fluids and antibiotics Follow-up cultures and lactic acid level  -Hyperlipidemia Continue statin medication  -Acute Hypokalemia Replace potassium  -DVT prophylaxis subcu Lovenox daily  All the records are reviewed and case discussed with Care Management/Social Worker. Management plans discussed with the patient, family and they are in agreement.  CODE STATUS: Full code  DVT Prophylaxis: SCDs  TOTAL TIME TAKING CARE OF THIS PATIENT: 33 minutes.   POSSIBLE D/C IN  1 to 2 DAYS, DEPENDING ON CLINICAL  CONDITION.  Ihor AustinPavan Kindell Strada M.D on 12/06/2018 at 10:30 AM  Between 7am to 6pm - Pager - [phone]  After 6pm go to [email or web address] - password [email or web address]  [facility]  Office  [phone]  CC: Primary care physician; [facility] [employer]  Note: This dictation was prepared with Dragon dictation along with smaller phrase technology. Any transcriptional errors that result from  this process are unintentional.

## 2018-12-06 NOTE — Progress Notes (Signed)
Advanced care plan. Purpose of the Encounter: CODE STATUS Parties in Attendance: Patient Patient's Decision Capacity: Good Subjective/Patient's story: Laura Gilmore  is a 53 y.o. female with a known history of GERD, hypercholesteremia, hypothyroidism who is presenting to the emergency room with complaint of abdominal pain and high-grade fevers.  She states that the symptoms started few days ago and has progressively gotten worse.  She states that she is having abdominal pain in the lower abdomen with radiation to her back.  She denies any urinary symptoms.  She also complains of pain in her lower chest region.  In the emergency room she is noted to have UTI.  CT scan of the chest suggests right-sided pyelonephritis.  Patient denies any nausea vomiting or diarrhea. Objective/Medical story Patient has pyelonephritis on CAT scan. Needs IV antibiotics and IV fluids Needs cultures Goals of care determination:  Advance care directives goals of care treatment plan discussed For now patient wants everything done which includes CPR, intubation ventilator if the need arises CODE STATUS: Full code Time spent discussing advanced care planning: 16 minutes

## 2018-12-07 LAB — BASIC METABOLIC PANEL
Anion gap: 6 (ref 5–15)
BUN: 9 mg/dL (ref 6–20)
CO2: 25 mmol/L (ref 22–32)
Calcium: 8.1 mg/dL — ABNORMAL LOW (ref 8.9–10.3)
Chloride: 108 mmol/L (ref 98–111)
Creatinine, Ser: 0.8 mg/dL (ref 0.44–1.00)
GFR calc Af Amer: >60 mL/min (ref >60)
GFR calc non Af Amer: >60 mL/min (ref >60)
Glucose, Bld: 159 mg/dL — ABNORMAL HIGH (ref 70–99)
Potassium: 3.8 mmol/L (ref 3.5–5.1)
Sodium: 139 mmol/L (ref 135–145)

## 2018-12-07 LAB — URINE CULTURE: Culture: >=100000 — AB

## 2018-12-07 LAB — CBC
HCT: 32.9 % — ABNORMAL LOW (ref 36.0–46.0)
Hemoglobin: 10.8 g/dL — ABNORMAL LOW (ref 12.0–15.0)
MCH: 28.2 pg (ref 26.0–34.0)
MCHC: 32.8 g/dL (ref 30.0–36.0)
MCV: 85.9 fL (ref 80.0–100.0)
Platelets: 246 10*3/uL (ref 150–400)
RBC: 3.83 MIL/uL — ABNORMAL LOW (ref 3.87–5.11)
RDW: 15.1 % (ref 11.5–15.5)
WBC: 13.5 10*3/uL — ABNORMAL HIGH (ref 4.0–10.5)
nRBC: 0 % (ref 0.0–0.2)

## 2018-12-07 LAB — CULTURE, BLOOD (ROUTINE X 2): Special Requests: ADEQUATE

## 2018-12-07 MED ORDER — CEPHALEXIN 500 MG PO CAPS: 500.0000 mg | ORAL_CAPSULE | Freq: Four times a day (QID) | ORAL | 0 refills | Status: AC

## 2018-12-07 NOTE — Discharge Summary (Signed)
[facility] [place] [facility]   PATIENT NAME: Laura HonesMichelle Gilmore    MR#:  [id number]  DATE OF BIRTH:  04/18/1966  DATE OF ADMISSION:  12/05/2018 ADMITTING PHYSICIAN: Auburn BilberryShreyang Patel, MD  DATE OF DISCHARGE: 12/07/2018  PRIMARY CARE PHYSICIAN: [facility]   ADMISSION DIAGNOSIS:  Pyelonephritis [N12] Chest pain [R07.9] Sepsis, due to unspecified organism, unspecified whether acute organ dysfunction present (HCC) [A41.9]  DISCHARGE DIAGNOSIS:  Active Problems:   Acute pyelonephritis Sepsis secondary to pyelonephritis Atypical chest pain Hyperlipidemia GERD E. coli bacteremia E. coli UTI  SECONDARY DIAGNOSIS:   Past Medical History:  Diagnosis Date  . GERD (gastroesophageal reflux disease)   . Hypercholesterolemia   . Hypothyroidism      ADMITTING HISTORY Laura Gilmore  is a 53 y.o. female with a known history of GERD, hypercholesteremia, hypothyroidism who is presenting to the emergency room with complaint of abdominal pain and high-grade fevers.  She states that the symptoms started few days ago and has progressively gotten worse.  She states that she is having abdominal pain in the lower abdomen with radiation to her back.  She denies any urinary symptoms.  She also complains of pain in her lower chest region.  In the emergency room she is noted to have UTI.  CT scan of the chest suggests right-sided pyelonephritis.  Patient denies any nausea vomiting or diarrhea.  HOSPITAL COURSE:  Patient admitted to medical floor.  Patient was worked up with CT chest which showed a right-sided pyelonephritis.  She was also worked up with renal ultrasound which showed no abscess and no hydronephrosis.  Patient received IV meropenem antibiotic and leukocytosis improved.  Sepsis secondary to pyelonephritis improved.  Urine and blood cultures grew E. Coli.  Sensitivities were checked and patient switched to oral antibiotic.  Fever, flank pain resolved.  Patient  tolerated diet well.  CONSULTS OBTAINED:    DRUG ALLERGIES:  No Known Allergies  DISCHARGE MEDICATIONS:   Allergies as of 12/07/2018   No Known Allergies     Medication List    STOP taking these medications   naproxen sodium 220 MG tablet Commonly known as: ALEVE     TAKE these medications   acetaminophen 325 MG tablet Commonly known as: TYLENOL Take 650 mg by mouth every 6 (six) hours as needed.   aspirin EC 81 MG tablet Take 81 mg by mouth daily.   atorvastatin 10 MG tablet Commonly known as: LIPITOR Take 10 mg by mouth every evening.   cephALEXin 500 MG capsule Commonly known as: KEFLEX Take 1 capsule (500 mg total) by mouth 4 (four) times daily for 7 days.   fluticasone 50 MCG/ACT nasal spray Commonly known as: FLONASE Place 1 spray into both nostrils 2 (two) times a day.   ibuprofen 200 MG tablet Commonly known as: ADVIL Take 3 tablets (600 mg total) by mouth every 6 (six) hours as needed for cramping.   levonorgestrel 19.5 MCG/DAY Iud IUD Commonly known as: LILETTA 1 Intra Uterine Device by Intrauterine route once.   levothyroxine 50 MCG tablet Commonly known as: SYNTHROID Take 50 mcg by mouth daily. Take one tablet by mouth on an empty stomach with a glass of water at least 30-60 minutes before breakfast   ondansetron 4 MG disintegrating tablet Commonly known as: Zofran ODT Take 1 tablet (4 mg total) by mouth every 8 (eight) hours as needed. What changed: reasons to take this       Today  Patient seen and evaluated today  No fever No shortness of breath No flank pain Tolerating diet okay Hemodynamically stable  VITAL SIGNS:  Blood pressure 129/75, pulse 83, temperature 99.8 F (37.7 C), resp. rate 18, height 5\' 1"  (1.549 m), weight 107.5 kg, SpO2 95 %.  I/O:    Intake/Output Summary (Last 24 hours) at 12/07/2018 0951 Last data filed at 12/07/2018 0600 Gross per 24 hour  Intake 2859.93 ml  Output 1200 ml  Net 1659.93 ml    PHYSICAL  EXAMINATION:  Physical Exam  GENERAL:  53 y.o.-year-old patient lying in the bed with no acute distress.  LUNGS: Normal breath sounds bilaterally, no wheezing, rales,rhonchi or crepitation. No use of accessory muscles of respiration.  CARDIOVASCULAR: S1, S2 normal. No murmurs, rubs, or gallops.  ABDOMEN: Soft, non-tender, non-distended. Bowel sounds present. No organomegaly or mass.  NEUROLOGIC: Moves all 4 extremities. PSYCHIATRIC: The patient is alert and oriented x 3.  SKIN: No obvious rash, lesion, or ulcer.   DATA REVIEW:   CBC Recent Labs  Lab 12/07/18 0548  WBC 13.5*  HGB 10.8*  HCT 32.9*  PLT 246    Chemistries  Recent Labs  Lab 12/05/18 0228  12/07/18 0548  NA 135   < > 139  K 3.4*   < > 3.8  CL 103   < > 108  CO2 20*   < > 25  GLUCOSE 183*   < > 159*  BUN 14   < > 9  CREATININE 1.17*   < > 0.80  CALCIUM 8.8*   < > 8.1*  AST 32  --   --   ALT 26  --   --   ALKPHOS 135*  --   --   BILITOT 1.3*  --   --    < > = values in this interval not displayed.    Cardiac Enzymes No results for input(s): TROPONINI in the last 168 hours.  Microbiology Results  Results for orders placed or performed during the hospital encounter of 12/05/18  SARS Coronavirus 2 (CEPHEID- Performed in [facility] hospital lab), Hosp Order     Status: None   Collection Time: 12/05/18  6:00 AM   Specimen: Nasopharyngeal Swab  Result Value Ref Range Status   SARS Coronavirus 2 NEGATIVE NEGATIVE Final    Comment: (NOTE) If result is NEGATIVE SARS-CoV-2 target nucleic acids are NOT DETECTED. The SARS-CoV-2 RNA is generally detectable in upper and lower  respiratory specimens during the acute phase of infection. The lowest  concentration of SARS-CoV-2 viral copies this assay can detect is 250  copies / mL. A negative result does not preclude SARS-CoV-2 infection  and should not be used as the sole basis for treatment or other  patient management decisions.  A negative result may occur  with  improper specimen collection / handling, submission of specimen other  than nasopharyngeal swab, presence of viral mutation(s) within the  areas targeted by this assay, and inadequate number of viral copies  (<250 copies / mL). A negative result must be combined with clinical  observations, patient history, and epidemiological information. If result is POSITIVE SARS-CoV-2 target nucleic acids are DETECTED. The SARS-CoV-2 RNA is generally detectable in upper and lower  respiratory specimens dur ing the acute phase of infection.  Positive  results are indicative of active infection with SARS-CoV-2.  Clinical  correlation with patient history and other diagnostic information is  necessary to determine patient infection status.  Positive results do  not rule out bacterial infection or  co-infection with other viruses. If result is PRESUMPTIVE POSTIVE SARS-CoV-2 nucleic acids MAY BE PRESENT.   A presumptive positive result was obtained on the submitted specimen  and confirmed on repeat testing.  While 2019 novel coronavirus  (SARS-CoV-2) nucleic acids may be present in the submitted sample  additional confirmatory testing may be necessary for epidemiological  and / or clinical management purposes  to differentiate between  SARS-CoV-2 and other Sarbecovirus currently known to infect humans.  If clinically indicated additional testing with an alternate test  methodology [phone]) is advised. The SARS-CoV-2 RNA is generally  detectable in upper and lower respiratory sp ecimens during the acute  phase of infection. The expected result is Negative. Fact Sheet for Patients:  BoilerBrush.com.cy Fact Sheet for Healthcare Providers: https://pope.com/ This test is not yet approved or cleared by the [place] FDA and has been authorized for detection and/or diagnosis of SARS-CoV-2 by FDA under an Emergency Use Authorization (EUA).  This EUA  will remain in effect (meaning this test can be used) for the duration of the COVID-19 declaration under Section 564(b)(1) of the Act, 21 U.S.C. section 360bbb-3(b)(1), unless the authorization is terminated or revoked sooner. Performed at [facility], [address], [place], [place] [address]   Urine Culture     Status: Abnormal   Collection Time: 12/05/18  6:48 AM   Specimen: Urine, Random  Result Value Ref Range Status   Specimen Description   Final    URINE, RANDOM Performed at [facility], [address] [place], [place] [address]    Special Requests   Final    NONE Performed at [facility], [address] [place], [place] [address]    Culture >=100,000 COLONIES/mL ESCHERICHIA COLI (A)  Final   Report Status 12/07/2018 FINAL  Final   Organism ID, Bacteria ESCHERICHIA COLI (A)  Final      Susceptibility   Escherichia coli - MIC*    AMPICILLIN >=32 RESISTANT Resistant     CEFAZOLIN <=4 SENSITIVE Sensitive     CEFTRIAXONE <=1 SENSITIVE Sensitive     CIPROFLOXACIN <=0.25 SENSITIVE Sensitive     GENTAMICIN <=1 SENSITIVE Sensitive     IMIPENEM <=0.25 SENSITIVE Sensitive     NITROFURANTOIN <=16 SENSITIVE Sensitive     TRIMETH/SULFA <=20 SENSITIVE Sensitive     AMPICILLIN/SULBACTAM 16 INTERMEDIATE Intermediate     PIP/TAZO <=4 SENSITIVE Sensitive     Extended ESBL NEGATIVE Sensitive     * >=100,000 COLONIES/mL ESCHERICHIA COLI  Blood culture (routine x 2)     Status: Abnormal   Collection Time: 12/05/18  7:57 AM   Specimen: BLOOD  Result Value Ref Range Status   Specimen Description   Final    BLOOD RIGHT FA Performed at [facility], [address] [place], [place] [address]    Special Requests   Final    BOTTLES DRAWN AEROBIC AND ANAEROBIC Blood Culture adequate volume Performed at [facility], [address] [place], [place] [address]    Culture  Setup Time   Final    Organism ID to follow GRAM NEGATIVE  RODS IN BOTH AEROBIC AND ANAEROBIC BOTTLES CRITICAL RESULT CALLED TO, READ BACK BY AND VERIFIED WITH: JASON ROBBINS AT 1952 ON 12/05/2018 [facility] Performed at [facility], [address] [place], [place] [address]    Culture ESCHERICHIA COLI (A)  Final   Report Status 12/07/2018 FINAL  Final   Organism ID, Bacteria ESCHERICHIA COLI  Final  Susceptibility   Escherichia coli - MIC*    AMPICILLIN >=32 RESISTANT Resistant     CEFAZOLIN <=4 SENSITIVE Sensitive     CEFEPIME <=1 SENSITIVE Sensitive     CEFTAZIDIME <=1 SENSITIVE Sensitive     CEFTRIAXONE <=1 SENSITIVE Sensitive     CIPROFLOXACIN <=0.25 SENSITIVE Sensitive     GENTAMICIN <=1 SENSITIVE Sensitive     IMIPENEM <=0.25 SENSITIVE Sensitive     TRIMETH/SULFA <=20 SENSITIVE Sensitive     AMPICILLIN/SULBACTAM >=32 RESISTANT Resistant     PIP/TAZO <=4 SENSITIVE Sensitive     Extended ESBL NEGATIVE Sensitive     * ESCHERICHIA COLI  Blood culture (routine x 2)     Status: None (Preliminary result)   Collection Time: 12/05/18  7:57 AM   Specimen: BLOOD  Result Value Ref Range Status   Specimen Description BLOOD LEFT FA  Final   Special Requests   Final    BOTTLES DRAWN AEROBIC AND ANAEROBIC Blood Culture adequate volume   Culture   Final    NO GROWTH 2 DAYS Performed at [facility], [address] [place], [place] [address]    Report Status PENDING  Incomplete  Blood Culture ID Panel (Reflexed)     Status: Abnormal   Collection Time: 12/05/18  7:57 AM  Result Value Ref Range Status   Enterococcus species NOT DETECTED NOT DETECTED Final   Listeria monocytogenes NOT DETECTED NOT DETECTED Final   Staphylococcus species NOT DETECTED NOT DETECTED Final   Staphylococcus aureus (BCID) NOT DETECTED NOT DETECTED Final   Streptococcus species NOT DETECTED NOT DETECTED Final   Streptococcus agalactiae NOT DETECTED NOT DETECTED Final   Streptococcus pneumoniae NOT DETECTED NOT DETECTED Final   Streptococcus pyogenes  NOT DETECTED NOT DETECTED Final   Acinetobacter baumannii NOT DETECTED NOT DETECTED Final   Enterobacteriaceae species DETECTED (A) NOT DETECTED Final    Comment: Enterobacteriaceae represent a large family of gram-negative bacteria, not a single organism. CRITICAL RESULT CALLED TO, READ BACK BY AND VERIFIED WITH: JASON ROBBINS AT 1952 ON 12/05/2018 [facility]    Enterobacter cloacae complex NOT DETECTED NOT DETECTED Final   Escherichia coli DETECTED (A) NOT DETECTED Final    Comment: CRITICAL RESULT CALLED TO, READ BACK BY AND VERIFIED WITH: JASON ROBBINS AT 1952 ON 12/05/2018 [facility]    Klebsiella oxytoca NOT DETECTED NOT DETECTED Final   Klebsiella pneumoniae NOT DETECTED NOT DETECTED Final   Proteus species NOT DETECTED NOT DETECTED Final   Serratia marcescens NOT DETECTED NOT DETECTED Final   Carbapenem resistance NOT DETECTED NOT DETECTED Final   Haemophilus influenzae NOT DETECTED NOT DETECTED Final   Neisseria meningitidis NOT DETECTED NOT DETECTED Final   Pseudomonas aeruginosa NOT DETECTED NOT DETECTED Final   Candida albicans NOT DETECTED NOT DETECTED Final   Candida glabrata NOT DETECTED NOT DETECTED Final   Candida krusei NOT DETECTED NOT DETECTED Final   Candida parapsilosis NOT DETECTED NOT DETECTED Final   Candida tropicalis NOT DETECTED NOT DETECTED Final    Comment: Performed at [facility], [address], [place], [place] [address]    RADIOLOGY:  US Renal  Result Date: 12/06/2018 CLINICAL DATA:  Acute pyelonephritis. EXAM: RENAL / URINARY TRACT ULTRASOUND COMPLETE COMPARISON:  Contrast-enhanced CT yesterday. FINDINGS: Right Kidney: Renal measurements: 11.9 x 5.2 x 6.0 cm = volume: 196 mL. No intrarenal or perirenal fluid collection. Heterogeneous echogenicity in the lower pole. No mass or hydronephrosis visualized. Left Kidney: Renal measurements: 11.3 x 4.8 x 5.2 cm = volume:  147 mL. Echogenic focus in the mid kidney likely represents a prominent column of Bertin, no  calcifications on CT yesterday. Echogenicity within normal limits. No mass or hydronephrosis visualized. Bladder: Appears normal for degree of bladder distention. IMPRESSION: Heterogeneous echogenicity of the right lower kidney likely representing pyelonephritis. No intrarenal or perirenal fluid collection. No hydronephrosis. Electronically Signed   By: Narda RutherfordMelanie  Sanford M.D.   On: 12/06/2018 14:24    Follow up with PCP in 1 week.  Management plans discussed with the patient, family and they are in agreement.  CODE STATUS: Full code    Code Status Orders  (From admission, onward)         Start     Ordered   12/05/18 1301  Full code  Continuous     12/05/18 1300        Code Status History    This patient has a current code status but no historical code status.   Advance Care Planning Activity      TOTAL TIME TAKING CARE OF THIS PATIENT ON DAY OF DISCHARGE: more than 35 minutes.   Ihor AustinPavan  M.D on 12/07/2018 at 9:51 AM  Between 7am to 6pm - Pager - [phone]  After 6pm go to [email or web address] - password [email or web address]  [facility]  Office  [phone]  CC: Primary care physician; [facility]  Note: This dictation was prepared with Dragon dictation along with smaller phrase technology. Any transcriptional errors that result from this process are unintentional.

## 2018-12-10 LAB — CULTURE, BLOOD (ROUTINE X 2)
Culture: NO GROWTH
Special Requests: ADEQUATE

## 2019-08-14 ENCOUNTER — Other Ambulatory Visit: Payer: Self-pay | Admitting: Obstetrics and Gynecology

## 2019-08-14 DIAGNOSIS — Z1231 Encounter for screening mammogram for malignant neoplasm of breast: Secondary | ICD-10-CM

## 2019-10-10 ENCOUNTER — Ambulatory Visit
Admission: RE | Admit: 2019-10-10 | Discharge: 2019-10-10 | Disposition: A | Discharge status: Home / Self Care | Payer: 59 | Source: Ambulatory Visit | Attending: Obstetrics and Gynecology | Admitting: Obstetrics and Gynecology

## 2019-10-10 DIAGNOSIS — Z1231 Encounter for screening mammogram for malignant neoplasm of breast: Secondary | ICD-10-CM | POA: Diagnosis present

## 2020-08-20 ENCOUNTER — Other Ambulatory Visit: Payer: Self-pay | Admitting: Obstetrics and Gynecology

## 2020-08-20 DIAGNOSIS — Z1231 Encounter for screening mammogram for malignant neoplasm of breast: Secondary | ICD-10-CM

## 2020-10-10 ENCOUNTER — Ambulatory Visit
Admission: RE | Admit: 2020-10-10 | Discharge: 2020-10-10 | Disposition: A | Discharge status: Home / Self Care | Payer: 59 | Source: Ambulatory Visit | Attending: Obstetrics and Gynecology | Admitting: Obstetrics and Gynecology

## 2020-10-10 ENCOUNTER — Other Ambulatory Visit: Payer: Self-pay

## 2020-10-10 DIAGNOSIS — Z1231 Encounter for screening mammogram for malignant neoplasm of breast: Secondary | ICD-10-CM | POA: Insufficient documentation

## 2021-07-14 LAB — LIPID PANEL
Cholesterol: 205 — AB (ref 0–200)
HDL: 51 (ref 35–70)
LDL Cholesterol: 128
LDl/HDL Ratio: 4
Triglycerides: 132 (ref 40–160)

## 2021-07-14 LAB — BASIC METABOLIC PANEL
BUN: 18 (ref 4–21)
CO2: 25 — AB (ref 13–22)
Chloride: 105 (ref 99–108)
Creatinine: 0.8 (ref 0.5–1.1)
Glucose: 125
Potassium: 4.7 mEq/L (ref 3.5–5.1)
Sodium: 142 (ref 137–147)

## 2021-07-14 LAB — CBC AND DIFFERENTIAL
HCT: 45 (ref 36–46)
Hemoglobin: 14.7 (ref 12.0–16.0)
Platelets: 257 10*3/uL (ref 150–400)
WBC: 7.7

## 2021-07-14 LAB — TSH: TSH: 2.36 (ref 0.41–5.90)

## 2021-07-14 LAB — HEPATIC FUNCTION PANEL
ALT: 12 U/L (ref 7–35)
AST: 15 (ref 13–35)
Alkaline Phosphatase: 16.8 — AB (ref 25–125)
Bilirubin, Total: 0.4

## 2021-07-14 LAB — COMPREHENSIVE METABOLIC PANEL
Albumin: 4 (ref 3.5–5.0)
Calcium: 9.9 (ref 8.7–10.7)
eGFR: 75

## 2021-07-14 LAB — HEMOGLOBIN A1C: Hemoglobin A1C: 6.7

## 2021-07-14 LAB — CBC: RBC: 5.02 (ref 3.87–5.11)

## 2021-08-26 ENCOUNTER — Other Ambulatory Visit: Payer: Self-pay | Admitting: Obstetrics and Gynecology

## 2021-08-26 DIAGNOSIS — Z1231 Encounter for screening mammogram for malignant neoplasm of breast: Secondary | ICD-10-CM

## 2021-09-01 LAB — HM PAP SMEAR: HM Pap smear: NORMAL

## 2021-09-01 LAB — RESULTS CONSOLE HPV: CHL HPV: NEGATIVE

## 2021-10-19 ENCOUNTER — Ambulatory Visit
Admission: RE | Admit: 2021-10-19 | Discharge: 2021-10-19 | Disposition: A | Discharge status: Home / Self Care | Payer: 59 | Source: Ambulatory Visit | Attending: Obstetrics and Gynecology | Admitting: Obstetrics and Gynecology

## 2021-10-19 DIAGNOSIS — Z1231 Encounter for screening mammogram for malignant neoplasm of breast: Secondary | ICD-10-CM | POA: Diagnosis present

## 2022-01-14 LAB — CBC: RBC: 5.21 — AB (ref 3.87–5.11)

## 2022-01-14 LAB — PROTEIN / CREATININE RATIO, URINE
Albumin, U: <7
Creatinine, Urine: 39.5

## 2022-01-14 LAB — MICROALBUMIN / CREATININE URINE RATIO: Microalb Creat Ratio: <17.7

## 2022-01-14 LAB — LIPID PANEL
Cholesterol: 182 (ref 0–200)
HDL: 48 (ref 35–70)
LDL Cholesterol: 106
LDl/HDL Ratio: 3.8
Triglycerides: 142 (ref 40–160)

## 2022-01-14 LAB — CBC AND DIFFERENTIAL
HCT: 46 (ref 36–46)
Hemoglobin: 15.3 (ref 12.0–16.0)
Platelets: 258 10*3/uL (ref 150–400)
WBC: 6.7

## 2022-01-14 LAB — TSH: TSH: 3.53 (ref 0.41–5.90)

## 2022-01-14 LAB — HEMOGLOBIN A1C: Hemoglobin A1C: 6.8

## 2022-03-18 ENCOUNTER — Ambulatory Visit: Payer: Self-pay | Admitting: Family Medicine

## 2022-07-13 ENCOUNTER — Ambulatory Visit: Payer: 59 | Admitting: Physician Assistant

## 2022-07-13 ENCOUNTER — Encounter: Payer: Self-pay | Admitting: Physician Assistant

## 2022-07-13 VITALS — BP 131/79 | HR 64 | Wt 247.8 lb

## 2022-07-13 DIAGNOSIS — E1169 Type 2 diabetes mellitus with other specified complication: Secondary | ICD-10-CM

## 2022-07-13 DIAGNOSIS — E119 Type 2 diabetes mellitus without complications: Secondary | ICD-10-CM

## 2022-07-13 DIAGNOSIS — E039 Hypothyroidism, unspecified: Secondary | ICD-10-CM | POA: Insufficient documentation

## 2022-07-13 DIAGNOSIS — M25541 Pain in joints of right hand: Secondary | ICD-10-CM | POA: Diagnosis not present

## 2022-07-13 DIAGNOSIS — M25542 Pain in joints of left hand: Secondary | ICD-10-CM | POA: Insufficient documentation

## 2022-07-13 DIAGNOSIS — E785 Hyperlipidemia, unspecified: Secondary | ICD-10-CM

## 2022-07-13 MED ORDER — MELOXICAM 15 MG PO TABS: 15.0000 mg | ORAL_TABLET | Freq: Every day | ORAL | 1 refills | Status: DC

## 2022-07-13 MED ORDER — SEMAGLUTIDE(0.25 OR 0.5MG/DOS) 2 MG/3ML ~~LOC~~ SOPN: 0.2500 mg | PEN_INJECTOR | SUBCUTANEOUS | 1 refills | Status: DC

## 2022-07-13 NOTE — Assessment & Plan Note (Signed)
Pt manages on lipitor 40 mg. Educated on benefits aside from cholesterol number improvement-- protection against heart attack/stroke  Advised given her family history, DM, weight, it would be better to stay on a statin long term.  We can work on the dose based on future diet/exercise/weight loss

## 2022-07-13 NOTE — Progress Notes (Signed)
New patient visit   Patient: Laura Gilmore   DOB: 10/30/1965   57 y.o. Female  MRN: [id number] Visit Date: 07/13/2022  Today's healthcare provider: Mikey Kirschner, PA-C  Cc. New pt, discuss health goals.  Subjective    Laura Gilmore is a 57 y.o. female who presents today as a new patient to establish care.  HPI   Pt reports being diagnosed with type 2 diabetes a few years ago. She was started on metformin and has not tried anything else. She reports walking ~30 minutes a day, and tries to do ~20 minutes of other exercise on some week days. She admits to sometimes eating poorly-- carbs, as she works ~12 hours a day and sometimes on the weekends. She has a 8 year old daughter.  She reports occasional hand/wrist pain. Reports ibuprofen works for the pain, but when she takes this, at night her hands go numb. Reports being treated for carpal tunnel with no improvement.  Her overall health goals are to take less medication and be healthier going forward.  Past Medical History:  Diagnosis Date   Arthritis    GERD (gastroesophageal reflux disease)    Hypercholesterolemia    Hypothyroidism    Past Surgical History:  Procedure Laterality Date   CESAREAN SECTION  2006   COLONOSCOPY WITH PROPOFOL N/A 03/24/2018   Procedure: COLONOSCOPY WITH PROPOFOL;  Surgeon: Lollie Sails, MD;  Location: [facility] ENDOSCOPY;  Service: Endoscopy;  Laterality: N/A;   EYE SURGERY  2009   Lasik   HYSTEROSCOPY WITH D & C N/A 10/14/2017   Procedure: DILATATION AND CURETTAGE /HYSTEROSCOPY;  Surgeon: Ward, Honor Loh, MD;  Location: [facility] ORS;  Service: Gynecology;  Laterality: N/A;   INTRAUTERINE DEVICE (IUD) INSERTION N/A 10/14/2017   Procedure: INTRAUTERINE DEVICE (IUD) INSERTION;  Surgeon: Ward, Honor Loh, MD;  Location: [facility] ORS;  Service: Gynecology;  Laterality: N/A;   POLYPECTOMY N/A 10/14/2017   Procedure: POLYPECTOMY;  Surgeon: Ward, Honor Loh, MD;  Location: [facility] ORS;  Service:  Gynecology;  Laterality: N/A;   REFRACTIVE SURGERY Bilateral    Family Status  Relation Name Status   Mother Jodene Nam Deceased   Father Eugene Gavia Deceased   Sister Duard Larsen (Not Specified)   Neg Hx  (Not Specified)   Family History  Problem Relation Age of Onset   Cancer Mother    Arthritis Mother    Heart attack Father    Stroke Father    Diabetes Father    Kidney disease Sister    Breast cancer Neg Hx    Social History   Socioeconomic History   Marital status: Married    Spouse name: Not on file   Number of children: Not on file   Years of education: Not on file   Highest education level: Not on file  Occupational History   Not on file  Tobacco Use   Smoking status: Former    Packs/day: 0.00    Years: 5.00    Total pack years: 0.00    Types: Cigarettes    Quit date: 04/09/2004    Years since quitting: 18.2   Smokeless tobacco: Never  Vaping Use   Vaping Use: Never used  Substance and Sexual Activity   Alcohol use: Not Currently    Comment: rare   Drug use: Never   Sexual activity: Yes    Birth control/protection: I.U.D.  Other Topics Concern   Not on file  Social History Narrative   Not on file  Social Determinants of Health   Financial Resource Strain: Not on file  Food Insecurity: Not on file  Transportation Needs: Not on file  Physical Activity: Not on file  Stress: Not on file  Social Connections: Not on file   Outpatient Medications Prior to Visit  Medication Sig   acetaminophen (TYLENOL) 325 MG tablet Take 650 mg by mouth every 6 (six) hours as needed.   aspirin EC 81 MG tablet Take 81 mg by mouth daily.    atorvastatin (LIPITOR) 40 MG tablet Take 40 mg by mouth daily.   fluticasone (FLONASE) 50 MCG/ACT nasal spray Place 1 spray into both nostrils 2 (two) times a day.   ibuprofen (ADVIL,MOTRIN) 200 MG tablet Take 3 tablets (600 mg total) by mouth every 6 (six) hours as needed for cramping.   levonorgestrel (LILETTA) 19.5 MCG/DAY  IUD IUD 1 Intra Uterine Device by Intrauterine route once.   levothyroxine (SYNTHROID) 50 MCG tablet Take 50 mcg by mouth daily. Take one tablet by mouth on an empty stomach with a glass of water at least 30-60 minutes before breakfast   metFORMIN (GLUCOPHAGE-XR) 500 MG 24 hr tablet SMARTSIG:1 Tablet(s) By Mouth Every Evening   ondansetron (ZOFRAN ODT) 4 MG disintegrating tablet Take 1 tablet (4 mg total) by mouth every 8 (eight) hours as needed.   traZODone (DESYREL) 50 MG tablet TAKE 1 TABLET BY MOUTH EVERY DAY AT NIGHT   [DISCONTINUED] atorvastatin (LIPITOR) 10 MG tablet Take 10 mg by mouth every evening.   No facility-administered medications prior to visit.   No Known Allergies  Immunization History  Administered Date(s) Administered   Influenza Inj Mdck Quad Pf 01/24/2018   Influenza-Unspecified 02/12/2016   Tdap 01/07/2016   Zoster Recombinat (Shingrix) 01/24/2018, 06/01/2018    Health Maintenance  Topic Date Due   COVID-19 Vaccine (1) Never done   FOOT EXAM  Never done   OPHTHALMOLOGY EXAM  Never done   HIV Screening  Never done   Hepatitis C Screening  Never done   Diabetic kidney evaluation - eGFR measurement  07/15/2022   INFLUENZA VACCINE  08/08/2022 (Originally 12/08/2021)   HEMOGLOBIN A1C  07/15/2022   Diabetic kidney evaluation - Urine ACR  01/15/2023   MAMMOGRAM  10/20/2023   PAP SMEAR-Modifier  09/01/2024   DTaP/Tdap/Td (2 - Td or Tdap) 01/06/2026   COLONOSCOPY (Pts 45-19yr Insurance coverage will need to be confirmed)  03/24/2028   Zoster Vaccines- Shingrix  Completed   HPV VACCINES  Aged Out    Patient Care Team: DMikey Kirschner PA-C as PCP - General (Physician Assistant)  Review of Systems  Constitutional:  Negative for fatigue and fever.  Respiratory:  Negative for cough and shortness of breath.   Cardiovascular:  Negative for chest pain and leg swelling.  Gastrointestinal:  Negative for abdominal pain.  Musculoskeletal:  Positive for arthralgias.   Neurological:  Negative for dizziness and headaches.      Objective    BP 131/79 (BP Location: Left Arm, Patient Position: Sitting, Cuff Size: Normal)   Pulse 64   Wt 247 lb 12.8 oz (112.4 kg)   SpO2 100%   BMI 46.82 kg/m   Physical Exam Constitutional:      General: She is awake.     Appearance: She is well-developed.  HENT:     Head: Normocephalic.  Eyes:     Conjunctiva/sclera: Conjunctivae normal.  Cardiovascular:     Rate and Rhythm: Normal rate and regular rhythm.     Heart sounds: Normal  heart sounds.  Pulmonary:     Effort: Pulmonary effort is normal.     Breath sounds: Normal breath sounds.  Skin:    General: Skin is warm.  Neurological:     Mental Status: She is alert and oriented to person, place, and time.  Psychiatric:        Attention and Perception: Attention normal.        Mood and Affect: Mood normal.        Speech: Speech normal.        Behavior: Behavior is cooperative.     Depression Screen    07/13/2022    8:16 AM  PHQ 2/9 Scores  PHQ - 2 Score 0  PHQ- 9 Score 0   Results for orders placed or performed in visit on 07/13/22  CBC and differential  Result Value Ref Range   Hemoglobin 15.3 12.0 - 16.0   HCT 46 36 - 46   Platelets 258 150 - 400 K/uL   WBC 6.7   CBC  Result Value Ref Range   RBC 5.21 (A) 3.87 - 5.11  Microalbumin / creatinine urine ratio  Result Value Ref Range   Microalb Creat Ratio <17.7   Protein / creatinine ratio, urine  Result Value Ref Range   Creatinine, Urine 39.5    Albumin, U <7   Lipid panel  Result Value Ref Range   LDl/HDL Ratio 3.8    Triglycerides 142 40 - 160   Cholesterol 182 0 - 200   HDL 48 35 - 70   LDL Cholesterol 106   Hemoglobin A1c  Result Value Ref Range   Hemoglobin A1C 6.8   TSH  Result Value Ref Range   TSH 3.53 0.41 - 5.90  Results for orders placed or performed in visit on 07/13/22  CBC and differential  Result Value Ref Range   Hemoglobin 14.7 12.0 - 16.0   HCT 45 36 - 46    Platelets 257 150 - 400 K/uL   WBC 7.7   CBC  Result Value Ref Range   RBC 5.02 3.87 - [id number]  Basic metabolic panel  Result Value Ref Range   Glucose 125    BUN 18 4 - 21   CO2 25 (A) 13 - 22   Creatinine 0.8 0.5 - 1.1   Potassium 4.7 3.5 - 5.1 mEq/L   Sodium 142 137 - 147   Chloride 105 99 - 108  Comprehensive metabolic panel  Result Value Ref Range   eGFR 75    Calcium 9.9 8.7 - 10.7   Albumin 4.0 3.5 - 5.0  Lipid panel  Result Value Ref Range   LDl/HDL Ratio 4.0    Triglycerides 132 40 - 160   Cholesterol 205 (A) 0 - 200   HDL 51 35 - 70   LDL Cholesterol 128   Hepatic function panel  Result Value Ref Range   Alkaline Phosphatase 16.8 (A) 25 - 125   ALT 12 7 - 35 U/L   AST 15 13 - 35   Bilirubin, Total 0.4   Hemoglobin A1c  Result Value Ref Range   Hemoglobin A1C 6.7   TSH  Result Value Ref Range   TSH 2.36 0.41 - 5.90    Assessment & Plan      Problem List Items Addressed This Visit       Endocrine   Hyperlipidemia associated with type 2 diabetes mellitus ([place])    Pt manages on lipitor 40 mg. Educated  on benefits aside from cholesterol number improvement-- protection against heart attack/stroke  Advised given her family history, DM, weight, it would be better to stay on a statin long term.  We can work on the dose based on future diet/exercise/weight loss       Relevant Medications   atorvastatin (LIPITOR) 40 MG tablet   metFORMIN (GLUCOPHAGE-XR) 500 MG 24 hr tablet   Semaglutide,0.25 or 0.'5MG'$ /DOS, 2 MG/3ML SOPN   Other Relevant Orders   Lipid Profile   CBC w/Diff/Platelet   Comprehensive Metabolic Panel (CMET)   Type 2 diabetes mellitus without complication, without long-term current use of insulin ([place]) - Primary    Reviewed records. Last A1c 6.8%  Managed only with metformin 500 mg XR. Discussed starting ozempic for additional weight loss benefit, which will help her towards her goal of glycemic control and overall better health. Start with  0.25 mg weekly x 4 weeks and then increase to 0.5 mg weekly. Pt does not check BS daily. Encouraged her to do so to better track diet.  Uacr utd  On statin, no ace/arb but uacr normal  Will check feet next visit  Will get optho records F/u 3 mo      Relevant Medications   atorvastatin (LIPITOR) 40 MG tablet   metFORMIN (GLUCOPHAGE-XR) 500 MG 24 hr tablet   Semaglutide,0.25 or 0.'5MG'$ /DOS, 2 MG/3ML SOPN   Other Relevant Orders   HgB A1c   CBC w/Diff/Platelet   Comprehensive Metabolic Panel (CMET)   Hypothyroidism    Will repeat tsh/t4      Relevant Orders   TSH + free T4     Other   Arthralgia of both hands    Rx mobic-- advised she should use this infrequently instead of ibuprofen      Relevant Medications   meloxicam (MOBIC) 15 MG tablet    Return in about 3 months (around 10/13/2022) for DMII.     I, Mikey Kirschner, PA-C have reviewed all documentation for this visit. The documentation on  07/13/22 for the exam, diagnosis, procedures, and orders are all accurate and complete.  Mikey Kirschner, PA-C [facility] [address] [place], [place], [address] Office: [phone] Fax: [place]

## 2022-07-13 NOTE — Assessment & Plan Note (Signed)
Reviewed records. Last A1c 6.8%  Managed only with metformin 500 mg XR. Discussed starting ozempic for additional weight loss benefit, which will help her towards her goal of glycemic control and overall better health. Start with 0.25 mg weekly x 4 weeks and then increase to 0.5 mg weekly. Pt does not check BS daily. Encouraged her to do so to better track diet.  Uacr utd  On statin, no ace/arb but uacr normal  Will check feet next visit  Will get optho records F/u 3 mo

## 2022-07-13 NOTE — Assessment & Plan Note (Signed)
Rx mobic-- advised she should use this infrequently instead of ibuprofen

## 2022-07-13 NOTE — Assessment & Plan Note (Signed)
Will repeat tsh/t4

## 2022-07-14 LAB — CBC WITH DIFFERENTIAL/PLATELET
Basophils Absolute: 0.1 10*3/uL (ref 0.0–0.2)
Basos: 1 %
EOS (ABSOLUTE): 0.1 10*3/uL (ref 0.0–0.4)
Eos: 1 %
Hematocrit: 44.9 % (ref 34.0–46.6)
Hemoglobin: 14.8 g/dL (ref 11.1–15.9)
Immature Grans (Abs): 0 10*3/uL (ref 0.0–0.1)
Immature Granulocytes: 0 %
Lymphocytes Absolute: 1.8 10*3/uL (ref 0.7–3.1)
Lymphs: 25 %
MCH: 29.8 pg (ref 26.6–33.0)
MCHC: 33 g/dL (ref 31.5–35.7)
MCV: 90 fL (ref 79–97)
Monocytes Absolute: 0.5 10*3/uL (ref 0.1–0.9)
Monocytes: 6 %
Neutrophils Absolute: 4.6 10*3/uL (ref 1.4–7.0)
Neutrophils: 67 %
Platelets: 266 10*3/uL (ref 150–450)
RBC: 4.97 x10E6/uL (ref 3.77–5.28)
RDW: 12.8 % (ref 11.7–15.4)
WBC: 7 10*3/uL (ref 3.4–10.8)

## 2022-07-14 LAB — COMPREHENSIVE METABOLIC PANEL
ALT: 22 IU/L (ref 0–32)
AST: 20 IU/L (ref 0–40)
Albumin/Globulin Ratio: 1.5 (ref 1.2–2.2)
Albumin: 4.3 g/dL (ref 3.8–4.9)
Alkaline Phosphatase: 184 IU/L — ABNORMAL HIGH (ref 44–121)
BUN/Creatinine Ratio: 13 (ref 9–23)
BUN: 12 mg/dL (ref 6–24)
Bilirubin Total: 0.4 mg/dL (ref 0.0–1.2)
CO2: 22 mmol/L (ref 20–29)
Calcium: 9.7 mg/dL (ref 8.7–10.2)
Chloride: 104 mmol/L (ref 96–106)
Creatinine, Ser: 0.9 mg/dL (ref 0.57–1.00)
Globulin, Total: 2.8 g/dL (ref 1.5–4.5)
Glucose: 137 mg/dL — ABNORMAL HIGH (ref 70–99)
Potassium: 4.7 mmol/L (ref 3.5–5.2)
Sodium: 141 mmol/L (ref 134–144)
Total Protein: 7.1 g/dL (ref 6.0–8.5)
eGFR: 76 mL/min/{1.73_m2} (ref >59)

## 2022-07-14 LAB — LIPID PANEL
Chol/HDL Ratio: 3.8 ratio (ref 0.0–4.4)
Cholesterol, Total: 195 mg/dL (ref 100–199)
HDL: 51 mg/dL (ref >39)
LDL Chol Calc (NIH): 118 mg/dL — ABNORMAL HIGH (ref 0–99)
Triglycerides: 146 mg/dL (ref 0–149)
VLDL Cholesterol Cal: 26 mg/dL (ref 5–40)

## 2022-07-14 LAB — TSH+FREE T4
Free T4: 1.14 ng/dL (ref 0.82–1.77)
TSH: 2.53 u[IU]/mL (ref 0.450–4.500)

## 2022-07-14 LAB — HEMOGLOBIN A1C
Est. average glucose Bld gHb Est-mCnc: 154 mg/dL
Hgb A1c MFr Bld: 7 % — ABNORMAL HIGH (ref 4.8–5.6)

## 2022-08-31 ENCOUNTER — Other Ambulatory Visit: Payer: Self-pay | Admitting: Physician Assistant

## 2022-08-31 DIAGNOSIS — M25541 Pain in joints of right hand: Secondary | ICD-10-CM

## 2022-09-02 ENCOUNTER — Ambulatory Visit: Payer: Self-pay | Admitting: Physician Assistant

## 2022-09-06 ENCOUNTER — Other Ambulatory Visit: Payer: Self-pay | Admitting: Obstetrics and Gynecology

## 2022-09-06 DIAGNOSIS — Z1231 Encounter for screening mammogram for malignant neoplasm of breast: Secondary | ICD-10-CM

## 2022-10-13 ENCOUNTER — Other Ambulatory Visit: Payer: Self-pay | Admitting: Physician Assistant

## 2022-10-13 ENCOUNTER — Ambulatory Visit: Payer: 59 | Admitting: Physician Assistant

## 2022-10-13 VITALS — BP 105/72 | HR 65 | Temp 98.2°F | Resp 13 | Ht 61.0 in | Wt 234.2 lb

## 2022-10-13 DIAGNOSIS — R110 Nausea: Secondary | ICD-10-CM

## 2022-10-13 DIAGNOSIS — K219 Gastro-esophageal reflux disease without esophagitis: Secondary | ICD-10-CM | POA: Diagnosis not present

## 2022-10-13 DIAGNOSIS — E119 Type 2 diabetes mellitus without complications: Secondary | ICD-10-CM

## 2022-10-13 LAB — POCT GLYCOSYLATED HEMOGLOBIN (HGB A1C): Hemoglobin A1C: 6.1 % — AB (ref 4.0–5.6)

## 2022-10-13 MED ORDER — ONDANSETRON 4 MG PO TBDP: 4.0000 mg | ORAL_TABLET | Freq: Three times a day (TID) | ORAL | 0 refills | Status: DC | PRN

## 2022-10-13 MED ORDER — SUCRALFATE 1 G PO TABS: 1.0000 g | ORAL_TABLET | Freq: Three times a day (TID) | ORAL | 0 refills | Status: DC

## 2022-10-13 MED ORDER — SEMAGLUTIDE (1 MG/DOSE) 4 MG/3ML ~~LOC~~ SOPN: 1.0000 mg | PEN_INJECTOR | SUBCUTANEOUS | 1 refills | Status: DC

## 2022-10-13 NOTE — Progress Notes (Signed)
I,Laura  Gilmore,acting as a Neurosurgeon for [employer], PA-C.,have documented all relevant documentation on the behalf of Laura Ferguson, PA-C,as directed by  Laura Ferguson, PA-C while in the presence of Laura Ferguson, PA-C.    Established patient visit   Patient: Laura Gilmore   DOB: 06/22/1965   57 y.o. Female  MRN: [id number] Visit Date: 10/13/2022  Today's healthcare provider: [name], PA-C   Cc. DM II f/u   Subjective    HPI  Diabetes Mellitus Type II, Follow-up  Lab Results  Component Value Date   HGBA1C 6.1 (A) 10/13/2022   HGBA1C 7.0 (H) 07/13/2022   HGBA1C 6.8 01/14/2022   Wt Readings from Last 3 Encounters:  10/13/22 234 lb 3.2 oz (106.2 kg)  07/13/22 247 lb 12.8 oz (112.4 kg)  12/05/18 237 lb (107.5 kg)   Last seen for diabetes 3 months ago.   Discussed the use of AI scribe software for clinical note transcription with the patient, who gave verbal consent to proceed.  History of Present Illness   The patient, with a history of diabetes, presents for a follow-up visit after starting Ozempic. They report that the medication has been effective in controlling their blood sugar levels and has also led to some weight loss. However, they have experienced side effects including indigestion and watery diarrhea, particularly after consuming certain foods. They note that these symptoms are more pronounced when they consume unhealthy foods, such as cake, and are less severe or absent when they eat healthier options. They have been managing these side effects with Tums and have also been taking a daily Metamucil pill. They express satisfaction with the medication's effects.  In addition to their diabetes management, the patient also mentions an upcoming cruise trip. They express concerns about managing their diet and medication side effects during the trip.      Pertinent Labs: Lab Results  Component Value Date   CHOL 195 07/13/2022   HDL 51 07/13/2022    LDLCALC 118 (H) 07/13/2022   TRIG 146 07/13/2022   CHOLHDL 3.8 07/13/2022   Lab Results  Component Value Date   NA 141 07/13/2022   K 4.7 07/13/2022   CREATININE 0.90 07/13/2022   EGFR 76 07/13/2022   MICRALBCREAT <17.7 01/14/2022     ---------------------------------------------------------------------------------------------------   Medications: Outpatient Medications Prior to Visit  Medication Sig   aspirin EC 81 MG tablet Take 81 mg by mouth daily.    atorvastatin (LIPITOR) 40 MG tablet Take 40 mg by mouth daily.   levonorgestrel (LILETTA) 19.5 MCG/DAY IUD IUD 1 Intra Uterine Device by Intrauterine route once.   levothyroxine (SYNTHROID) 50 MCG tablet Take 50 mcg by mouth daily. Take one tablet by mouth on an empty stomach with a glass of water at least 30-60 minutes before breakfast   meloxicam (MOBIC) 15 MG tablet TAKE 1 TABLET (15 MG TOTAL) BY MOUTH DAILY.   metFORMIN (GLUCOPHAGE-XR) 500 MG 24 hr tablet SMARTSIG:1 Tablet(s) By Mouth Every Evening   [DISCONTINUED] Semaglutide,0.25 or 0.5MG /DOS, 2 MG/3ML SOPN Inject 0.25 mg into the skin once a week. For four weeks and then increase to 0.5 mg weekly   traZODone (DESYREL) 50 MG tablet TAKE 1 TABLET BY MOUTH EVERY DAY AT NIGHT (Patient not taking: Reported on 10/13/2022)   [DISCONTINUED] acetaminophen (TYLENOL) 325 MG tablet Take 650 mg by mouth every 6 (six) hours as needed.   [DISCONTINUED] fluticasone (FLONASE) 50 MCG/ACT nasal spray Place 1 spray into both nostrils 2 (two) times a  day.   [DISCONTINUED] ibuprofen (ADVIL,MOTRIN) 200 MG tablet Take 3 tablets (600 mg total) by mouth every 6 (six) hours as needed for cramping.   [DISCONTINUED] ondansetron (ZOFRAN ODT) 4 MG disintegrating tablet Take 1 tablet (4 mg total) by mouth every 8 (eight) hours as needed.   No facility-administered medications prior to visit.    Review of Systems  Constitutional:  Negative for fatigue and fever.  Respiratory:  Negative for cough and  shortness of breath.   Cardiovascular:  Negative for chest pain and leg swelling.  Gastrointestinal:  Positive for diarrhea. Negative for abdominal pain.       Gerd  Neurological:  Negative for dizziness and headaches.      Objective    BP 105/72 (BP Location: Right Arm, Patient Position: Sitting, Cuff Size: Normal)   Pulse 65   Temp 98.2 F (36.8 C) (Oral)   Resp 13   Ht 5\' 1"  (1.549 m)   Wt 234 lb 3.2 oz (106.2 kg)   SpO2 100%   BMI 44.25 kg/m   Physical Exam Vitals reviewed.  Constitutional:      Appearance: She is not ill-appearing.  HENT:     Head: Normocephalic.  Eyes:     Conjunctiva/sclera: Conjunctivae normal.  Cardiovascular:     Rate and Rhythm: Normal rate.     Pulses:          Dorsalis pedis pulses are 3+ on the right side and 3+ on the left side.       Posterior tibial pulses are 3+ on the right side and 3+ on the left side.  Pulmonary:     Effort: Pulmonary effort is normal. No respiratory distress.  Feet:     Right foot:     Protective Sensation: 4 sites tested.  4 sites sensed.     Skin integrity: Callus and dry skin present.     Toenail Condition: Right toenails are normal.     Left foot:     Protective Sensation: 4 sites tested.  4 sites sensed.     Skin integrity: Callus and dry skin present.     Toenail Condition: Left toenails are normal.  Neurological:     General: No focal deficit present.     Mental Status: She is alert and oriented to person, place, and time.  Psychiatric:        Mood and Affect: Mood normal.        Behavior: Behavior normal.     Results for orders placed or performed in visit on 10/13/22  POCT glycosylated hemoglobin (Hb A1C)  Result Value Ref Range   Hemoglobin A1C 6.1 (A) 4.0 - 5.6 %   HbA1c POC (<> result, manual entry)     HbA1c, POC (prediabetic range)     HbA1c, POC (controlled diabetic range)      Assessment & Plan     Problem List Items Addressed This Visit       Endocrine   Type 2 diabetes mellitus  without complication, without long-term current use of insulin ([facility]) - Primary    A1c today 6.1%, previously 7.0% Improved control with Ozempic 0.5mg , with noted weight loss. Patient is motivated to increase the dose for further benefits. -Increase Ozempic to 1mg . -Continue Metamucil for fiber supplementation. -Provide Carafate for potential severe indigestion and Zofran for potential nausea, particularly for upcoming cruise. -Advise smaller, more frequent meals to help manage side effects.  On statin, no ace/arb  but uacr normal and utd  Foot  exam completed today, per pt optho utd but need records  -Check A1c in 4 months.            Relevant Medications   Semaglutide, 1 MG/DOSE, 4 MG/3ML SOPN   Other Relevant Orders   POCT glycosylated hemoglobin (Hb A1C) (Completed)   Other Visit Diagnoses     Nausea       Relevant Medications   ondansetron (ZOFRAN-ODT) 4 MG disintegrating tablet   Gastroesophageal reflux disease, unspecified whether esophagitis present       Relevant Medications   sucralfate (CARAFATE) 1 g tablet   ondansetron (ZOFRAN-ODT) 4 MG disintegrating tablet        Return in about 4 months (around 02/12/2023) for DMII. Transfer pt to Dr Payton Mccallum.     I, Laura Ferguson, PA-C have reviewed all documentation for this visit. The documentation on  10/13/22   for the exam, diagnosis, procedures, and orders are all accurate and complete. Laura Ferguson, PA-C [facility] [address] [place], [place], [address] Office: [phone] Fax: [phone]   [facility]

## 2022-10-13 NOTE — Assessment & Plan Note (Signed)
A1c today 6.1%, previously 7.0% Improved control with Ozempic 0.5mg , with noted weight loss. Patient is motivated to increase the dose for further benefits. -Increase Ozempic to 1mg . -Continue Metamucil for fiber supplementation. -Provide Carafate for potential severe indigestion and Zofran for potential nausea, particularly for upcoming cruise. -Advise smaller, more frequent meals to help manage side effects.  On statin, no ace/arb  but uacr normal and utd  Foot exam completed today, per pt optho utd but need records  -Check A1c in 4 months.

## 2022-10-18 ENCOUNTER — Other Ambulatory Visit: Payer: Self-pay | Admitting: Physician Assistant

## 2022-10-18 DIAGNOSIS — E119 Type 2 diabetes mellitus without complications: Secondary | ICD-10-CM

## 2022-10-19 ENCOUNTER — Other Ambulatory Visit: Payer: Self-pay | Admitting: Physician Assistant

## 2022-10-19 DIAGNOSIS — E119 Type 2 diabetes mellitus without complications: Secondary | ICD-10-CM

## 2022-10-19 MED ORDER — OZEMPIC (1 MG/DOSE) 4 MG/3ML ~~LOC~~ SOPN: PEN_INJECTOR | SUBCUTANEOUS | 3 refills | Status: DC

## 2022-10-19 NOTE — Telephone Encounter (Signed)
Started PA  [id number]

## 2022-10-19 NOTE — Telephone Encounter (Signed)
This medication or product was previously approved on [id number] from 2022-07-14 to 2023-07-14. You will be able to fill a prescription for this medication at your pharmacy.

## 2022-10-21 ENCOUNTER — Ambulatory Visit
Admission: RE | Admit: 2022-10-21 | Discharge: 2022-10-21 | Disposition: A | Discharge status: Home / Self Care | Payer: 59 | Source: Ambulatory Visit | Attending: Obstetrics and Gynecology | Admitting: Obstetrics and Gynecology

## 2022-10-21 DIAGNOSIS — Z1231 Encounter for screening mammogram for malignant neoplasm of breast: Secondary | ICD-10-CM | POA: Insufficient documentation

## 2022-10-29 ENCOUNTER — Telehealth: Payer: 59 | Admitting: Nurse Practitioner

## 2022-10-29 DIAGNOSIS — R300 Dysuria: Secondary | ICD-10-CM

## 2022-10-29 MED ORDER — CEPHALEXIN 500 MG PO CAPS
500.0000 mg | ORAL_CAPSULE | Freq: Two times a day (BID) | ORAL | 0 refills | Status: AC
Start: 2022-10-29 — End: 2022-11-05

## 2022-10-29 NOTE — Progress Notes (Signed)
Virtual Visit Consent   Laura Gilmore, you are scheduled for a virtual visit with a [facility] provider today. Just as with appointments in the office, your consent must be obtained to participate. Your consent will be active for this visit and any virtual visit you may have with one of our providers in the next 365 days. If you have a MyChart account, a copy of this consent can be sent to you electronically.  As this is a virtual visit, video technology does not allow for your provider to perform a traditional examination. This may limit your provider's ability to fully assess your condition. If your provider identifies any concerns that need to be evaluated in person or the need to arrange testing (such as labs, EKG, etc.), we will make arrangements to do so. Although advances in technology are sophisticated, we cannot ensure that it will always work on either your end or our end. If the connection with a video visit is poor, the visit may have to be switched to a telephone visit. With either a video or telephone visit, we are not always able to ensure that we have a secure connection.  By engaging in this virtual visit, you consent to the provision of healthcare and authorize for your insurance to be billed (if applicable) for the services provided during this visit. Depending on your insurance coverage, you may receive a charge related to this service.  I need to obtain your verbal consent now. Are you willing to proceed with your visit today? Laura Gilmore has provided verbal consent on 10/29/2022 for a virtual visit (video or telephone). Viviano Simas, FNP  Date: 10/29/2022 9:41 AM  Virtual Visit via Video Note   I, Viviano Simas, connected with  Laura Gilmore  ([id number], 21-Jan-1966) on 10/29/22 at  9:45 AM EDT by a video-enabled telemedicine application and verified that I am speaking with the correct person using two identifiers.  Location: Patient: Virtual Visit Location  Patient: Home Provider: Virtual Visit Location Provider: Home Office   I discussed the limitations of evaluation and management by telemedicine and the availability of in person appointments. The patient expressed understanding and agreed to proceed.    History of Present Illness: Laura Gilmore is a 57 y.o. who identifies as a female who was assigned female at birth, and is being seen today for possible UTI.  She has been experiencing burning with urination, urinary frequency and urgency   Symptom onset was 2 days ago   She has been taking cranberry gummies without relief   Denies a fever   Last UTI was 2020 Denies complicated history    Problems:  Patient Active Problem List   Diagnosis Date Noted   Hypothyroidism 07/13/2022   Arthralgia of both hands 07/13/2022   Type 2 diabetes mellitus without complication, without long-term current use of insulin (HCC) 01/05/2018   Hyperlipidemia associated with type 2 diabetes mellitus (HCC) 04/09/2015    Allergies: No Known Allergies Medications:  Current Outpatient Medications:    aspirin EC 81 MG tablet, Take 81 mg by mouth daily. , Disp: , Rfl:    atorvastatin (LIPITOR) 40 MG tablet, Take 40 mg by mouth daily., Disp: , Rfl:    levonorgestrel (LILETTA) 19.5 MCG/DAY IUD IUD, 1 Intra Uterine Device by Intrauterine route once., Disp: , Rfl:    levothyroxine (SYNTHROID) 50 MCG tablet, Take 50 mcg by mouth daily. Take one tablet by mouth on an empty stomach with a glass of water at  least 30-60 minutes before breakfast, Disp: , Rfl:    meloxicam (MOBIC) 15 MG tablet, TAKE 1 TABLET (15 MG TOTAL) BY MOUTH DAILY., Disp: 30 tablet, Rfl: 1   metFORMIN (GLUCOPHAGE-XR) 500 MG 24 hr tablet, SMARTSIG:1 Tablet(s) By Mouth Every Evening, Disp: , Rfl:    ondansetron (ZOFRAN-ODT) 4 MG disintegrating tablet, Take 1 tablet (4 mg total) by mouth every 8 (eight) hours as needed for nausea or vomiting., Disp: 20 tablet, Rfl: 0   Semaglutide, 1 MG/DOSE,  (OZEMPIC, 1 MG/DOSE,) 4 MG/3ML SOPN, INJECT 1 MG ONCE A WEEK AS DIRECTED, Disp: 3 mL, Rfl: 3   sucralfate (CARAFATE) 1 g tablet, Take 1 tablet (1 g total) by mouth 4 (four) times daily -  with meals and at bedtime. Take as needed for acid reflux, Disp: 30 tablet, Rfl: 0   traZODone (DESYREL) 50 MG tablet, TAKE 1 TABLET BY MOUTH EVERY DAY AT NIGHT (Patient not taking: Reported on 10/13/2022), Disp: , Rfl:   Observations/Objective: Patient is well-developed, well-nourished in no acute distress.  Resting comfortably  at home.  Head is normocephalic, atraumatic.  No labored breathing.  Speech is clear and coherent with logical content.  Patient is alert and oriented at baseline.    Assessment and Plan:  1. Dysuria  - cephALEXin (KEFLEX) 500 MG capsule; Take 1 capsule (500 mg total) by mouth 2 (two) times daily for 7 days.  Dispense: 14 capsule; Refill: 0     Follow Up Instructions: I discussed the assessment and treatment plan with the patient. The patient was provided an opportunity to ask questions and all were answered. The patient agreed with the plan and demonstrated an understanding of the instructions.  A copy of instructions were sent to the patient via MyChart unless otherwise noted below.     The patient was advised to call back or seek an in-person evaluation if the symptoms worsen or if the condition fails to improve as anticipated.  Time:  I spent 10 minutes with the patient via telehealth technology discussing the above problems/concerns.    Viviano Simas, FNP

## 2022-10-31 ENCOUNTER — Other Ambulatory Visit: Payer: Self-pay | Admitting: Physician Assistant

## 2022-10-31 DIAGNOSIS — E119 Type 2 diabetes mellitus without complications: Secondary | ICD-10-CM

## 2022-11-01 NOTE — Telephone Encounter (Signed)
Unable to refill per protocol, Rx expired. Discontinued 10/13/22, dose change.  Requested Prescriptions  Pending Prescriptions Disp Refills   OZEMPIC, 0.25 OR 0.5 MG/DOSE, 2 MG/3ML SOPN [Pharmacy Med Name: OZEMPIC 0.25-0.5 MG/DOSE PEN]  3    Sig: INJECT 0.25 MG INTO THE SKIN ONCE A WEEK. FOR FOUR WEEKS AND THEN INCREASE TO 0.5 MG WEEKLY     Endocrinology:  Diabetes - GLP-1 Receptor Agonists - semaglutide Failed - 10/31/2022  2:16 AM      Failed - HBA1C in normal range and within 180 days    Hemoglobin A1C  Date Value Ref Range Status  10/13/2022 6.1 (A) 4.0 - 5.6 % Final  01/14/2022 6.8  Final   Hgb A1c MFr Bld  Date Value Ref Range Status  07/13/2022 7.0 (H) 4.8 - 5.6 % Final    Comment:             Prediabetes: 5.7 - 6.4          Diabetes: >6.4          Glycemic control for adults with diabetes: <7.0          Passed - Cr in normal range and within 360 days    Creatinine, Ser  Date Value Ref Range Status  07/13/2022 0.90 0.57 - 1.00 mg/dL Final   Creatinine, Urine  Date Value Ref Range Status  01/14/2022 39.5  Final         Passed - Valid encounter within last 6 months    Recent Outpatient Visits           2 weeks ago Type 2 diabetes mellitus without complication, without long-term current use of insulin (HCC)   [facility] Ok Edwards, [place], PA-C   3 months ago Type 2 diabetes mellitus without complication, without long-term current use of insulin (HCC)   [facility] [name], PA-C       Future Appointments             In 3 months Pardue, Monico Blitz, DO [facility], [facility]

## 2022-12-02 ENCOUNTER — Telehealth: Payer: 59 | Admitting: Physician Assistant

## 2022-12-02 DIAGNOSIS — U071 COVID-19: Secondary | ICD-10-CM

## 2022-12-02 MED ORDER — BENZONATATE 100 MG PO CAPS
100.0000 mg | ORAL_CAPSULE | Freq: Three times a day (TID) | ORAL | 0 refills | Status: DC | PRN
Start: 2022-12-02 — End: 2023-01-17

## 2022-12-02 MED ORDER — NIRMATRELVIR/RITONAVIR (PAXLOVID)TABLET
3.0000 | ORAL_TABLET | Freq: Two times a day (BID) | ORAL | 0 refills | Status: AC
Start: 2022-12-02 — End: 2022-12-07

## 2022-12-02 NOTE — Patient Instructions (Signed)
Laura Gilmore, thank you for joining Piedad Climes, PA-C for today's virtual visit.  While this provider is not your primary care provider (PCP), if your PCP is located in our provider database this encounter information will be shared with them immediately following your visit.   A [facility] MyChart account gives you access to today's visit and all your visits, tests, and labs performed at [facility] " click here if you don't have a [facility] MyChart account or go to [email or web address]  Consent: (Patient) Laura Gilmore provided verbal consent for this virtual visit at the beginning of the encounter.  Current Medications:  Current Outpatient Medications:    benzonatate (TESSALON) 100 MG capsule, Take 1 capsule (100 mg total) by mouth 3 (three) times daily as needed for cough., Disp: 30 capsule, Rfl: 0   nirmatrelvir/ritonavir (PAXLOVID) 20 x 150 MG & 10 x 100MG  TABS, Take 3 tablets by mouth 2 (two) times daily for 5 days. (Take nirmatrelvir 150 mg two tablets twice daily for 5 days and ritonavir 100 mg one tablet twice daily for 5 days) Patient GFR is > 60, Disp: 30 tablet, Rfl: 0   aspirin EC 81 MG tablet, Take 81 mg by mouth daily. , Disp: , Rfl:    atorvastatin (LIPITOR) 40 MG tablet, Take 40 mg by mouth daily., Disp: , Rfl:    levonorgestrel (LILETTA) 19.5 MCG/DAY IUD IUD, 1 Intra Uterine Device by Intrauterine route once., Disp: , Rfl:    levothyroxine (SYNTHROID) 50 MCG tablet, Take 50 mcg by mouth daily. Take one tablet by mouth on an empty stomach with a glass of water at least 30-60 minutes before breakfast, Disp: , Rfl:    meloxicam (MOBIC) 15 MG tablet, TAKE 1 TABLET (15 MG TOTAL) BY MOUTH DAILY., Disp: 30 tablet, Rfl: 1   metFORMIN (GLUCOPHAGE-XR) 500 MG 24 hr tablet, SMARTSIG:1 Tablet(s) By Mouth Every Evening, Disp: , Rfl:    Semaglutide, 1 MG/DOSE, (OZEMPIC, 1 MG/DOSE,) 4 MG/3ML SOPN, INJECT 1 MG ONCE A WEEK AS DIRECTED, Disp: 3 mL, Rfl:  3   sucralfate (CARAFATE) 1 g tablet, Take 1 tablet (1 g total) by mouth 4 (four) times daily -  with meals and at bedtime. Take as needed for acid reflux, Disp: 30 tablet, Rfl: 0   traZODone (DESYREL) 50 MG tablet, TAKE 1 TABLET BY MOUTH EVERY DAY AT NIGHT (Patient not taking: Reported on 10/13/2022), Disp: , Rfl:    Medications ordered in this encounter:  Meds ordered this encounter  Medications   benzonatate (TESSALON) 100 MG capsule    Sig: Take 1 capsule (100 mg total) by mouth 3 (three) times daily as needed for cough.    Dispense:  30 capsule    Refill:  0    Order Specific Question:   Supervising Provider    Answer:   Loreli Dollar   nirmatrelvir/ritonavir (PAXLOVID) 20 x 150 MG & 10 x 100MG  TABS    Sig: Take 3 tablets by mouth 2 (two) times daily for 5 days. (Take nirmatrelvir 150 mg two tablets twice daily for 5 days and ritonavir 100 mg one tablet twice daily for 5 days) Patient GFR is > 60    Dispense:  30 tablet    Refill:  0    Order Specific Question:   Supervising Provider    Answer:   Merrilee Jansky [id number]     *If you need refills on other medications prior to your next appointment, please contact  your pharmacy*  Follow-Up: Call back or seek an in-person evaluation if the symptoms worsen or if the condition fails to improve as anticipated.  [facility] [phone]  Care Instructions: Please keep well-hydrated and get plenty of rest. Start a saline nasal rinse to flush out your nasal passages. You can use plain Mucinex to help thin congestion. Make sure to hold off on taking your Atorvastatin until you finish the Paxlovid. If you have a humidifier, running in the bedroom at night. I want you to start OTC vitamin D3 1000 units daily, vitamin C 1000 mg daily, and a zinc supplement. Please take prescribed medications as directed.  Isolation Instructions: You are to isolate at home until you have been fever free for at least 24 hours  without a fever-reducing medication, and symptoms have been steadily improving for 24 hours. At that time,  you can end isolation but need to mask for an additional 5 days.   If you must be around other household members who do not have symptoms, you need to make sure that both you and the family members are masking consistently with a high-quality mask.  If you note any worsening of symptoms despite treatment, please seek an in-person evaluation ASAP. If you note any significant shortness of breath or any chest pain, please seek ER evaluation. Please do not delay care!   COVID-19: What to Do if You Are Sick If you test positive and are an older adult or someone who is at high risk of getting very sick from COVID-19, treatment may be available. Contact a healthcare provider right away after a positive test to determine if you are eligible, even if your symptoms are mild right now. You can also visit a Test to Treat location and, if eligible, receive a prescription from a provider. Don't delay: Treatment must be started within the first few days to be effective. If you have a fever, cough, or other symptoms, you might have COVID-19. Most people have mild illness and are able to recover at home. If you are sick: Keep track of your symptoms. If you have an emergency warning sign (including trouble breathing), call 911. Steps to help prevent the spread of COVID-19 if you are sick If you are sick with COVID-19 or think you might have COVID-19, follow the steps below to care for yourself and to help protect other people in your home and community. Stay home except to get medical care Stay home. Most people with COVID-19 have mild illness and can recover at home without medical care. Do not leave your home, except to get medical care. Do not visit public areas and do not go to places where you are unable to wear a mask. Take care of yourself. Get rest and stay hydrated. Take over-the-counter medicines, such  as acetaminophen, to help you feel better. Stay in touch with your doctor. Call before you get medical care. Be sure to get care if you have trouble breathing, or have any other emergency warning signs, or if you think it is an emergency. Avoid public transportation, ride-sharing, or taxis if possible. Get tested If you have symptoms of COVID-19, get tested. While waiting for test results, stay away from others, including staying apart from those living in your household. Get tested as soon as possible after your symptoms start. Treatments may be available for people with COVID-19 who are at risk for becoming very sick. Don't delay: Treatment must be started early to be effective--some treatments  must begin within 5 days of your first symptoms. Contact your healthcare provider right away if your test result is positive to determine if you are eligible. Self-tests are one of several options for testing for the virus that causes COVID-19 and may be more convenient than laboratory-based tests and point-of-care tests. Ask your healthcare provider or your local health department if you need help interpreting your test results. You can visit your state, tribal, local, and territorial health department's website to look for the latest local information on testing sites. Separate yourself from other people As much as possible, stay in a specific room and away from other people and pets in your home. If possible, you should use a separate bathroom. If you need to be around other people or animals in or outside of the home, wear a well-fitting mask. Tell your close contacts that they may have been exposed to COVID-19. An infected person can spread COVID-19 starting 48 hours (or 2 days) before the person has any symptoms or tests positive. By letting your close contacts know they may have been exposed to COVID-19, you are helping to protect everyone. See COVID-19 and Animals if you have questions about pets. If you  are diagnosed with COVID-19, someone from the health department may call you. Answer the call to slow the spread. Monitor your symptoms Symptoms of COVID-19 include fever, cough, or other symptoms. Follow care instructions from your healthcare provider and local health department. Your local health authorities may give instructions on checking your symptoms and reporting information. When to seek emergency medical attention Look for emergency warning signs* for COVID-19. If someone is showing any of these signs, seek emergency medical care immediately: Trouble breathing Persistent pain or pressure in the chest New confusion Inability to wake or stay awake Pale, gray, or blue-colored skin, lips, or nail beds, depending on skin tone *This list is not all possible symptoms. Please call your medical provider for any other symptoms that are severe or concerning to you. Call 911 or call ahead to your local emergency facility: Notify the operator that you are seeking care for someone who has or may have COVID-19. Call ahead before visiting your doctor Call ahead. Many medical visits for routine care are being postponed or done by phone or telemedicine. If you have a medical appointment that cannot be postponed, call your doctor's office, and tell them you have or may have COVID-19. This will help the office protect themselves and other patients. If you are sick, wear a well-fitting mask You should wear a mask if you must be around other people or animals, including pets (even at home). Wear a mask with the best fit, protection, and comfort for you. You don't need to wear the mask if you are alone. If you can't put on a mask (because of trouble breathing, for example), cover your coughs and sneezes in some other way. Try to stay at least 6 feet away from other people. This will help protect the people around you. Masks should not be placed on young children under age 56 years, anyone who has trouble  breathing, or anyone who is not able to remove the mask without help. Cover your coughs and sneezes Cover your mouth and nose with a tissue when you cough or sneeze. Throw away used tissues in a lined trash can. Immediately wash your hands with soap and water for at least 20 seconds. If soap and water are not available, clean your hands with an alcohol-based hand  sanitizer that contains at least 60% alcohol. Clean your hands often Wash your hands often with soap and water for at least 20 seconds. This is especially important after blowing your nose, coughing, or sneezing; going to the bathroom; and before eating or preparing food. Use hand sanitizer if soap and water are not available. Use an alcohol-based hand sanitizer with at least 60% alcohol, covering all surfaces of your hands and rubbing them together until they feel dry. Soap and water are the best option, especially if hands are visibly dirty. Avoid touching your eyes, nose, and mouth with unwashed hands. Handwashing Tips Avoid sharing personal household items Do not share dishes, drinking glasses, cups, eating utensils, towels, or bedding with other people in your home. Wash these items thoroughly after using them with soap and water or put in the dishwasher. Clean surfaces in your home regularly Clean and disinfect high-touch surfaces (for example, doorknobs, tables, handles, light switches, and countertops) in your "sick room" and bathroom. In shared spaces, you should clean and disinfect surfaces and items after each use by the person who is ill. If you are sick and cannot clean, a caregiver or other person should only clean and disinfect the area around you (such as your bedroom and bathroom) on an as needed basis. Your caregiver/other person should wait as long as possible (at least several hours) and wear a mask before entering, cleaning, and disinfecting shared spaces that you use. Clean and disinfect areas that may have blood,  stool, or body fluids on them. Use household cleaners and disinfectants. Clean visible dirty surfaces with household cleaners containing soap or detergent. Then, use a household disinfectant. Use a product from [employer] List N: Disinfectants for Coronavirus (COVID-19). Be sure to follow the instructions on the label to ensure safe and effective use of the product. Many products recommend keeping the surface wet with a disinfectant for a certain period of time (look at "contact time" on the product label). You may also need to wear personal protective equipment, such as gloves, depending on the directions on the product label. Immediately after disinfecting, wash your hands with soap and water for 20 seconds. For completed guidance on cleaning and disinfecting your home, visit Complete Disinfection Guidance. Take steps to improve ventilation at home Improve ventilation (air flow) at home to help prevent from spreading COVID-19 to other people in your household. Clear out COVID-19 virus particles in the air by opening windows, using air filters, and turning on fans in your home. Use this interactive tool to learn how to improve air flow in your home. When you can be around others after being sick with COVID-19 Deciding when you can be around others is different for different situations. Find out when you can safely end home isolation. For any additional questions about your care, contact your healthcare provider or state or local health department. 07/29/2020 Content source: [facility] ([facility]), Division of Viral Diseases This information is not intended to replace advice given to you by your health care provider. Make sure you discuss any questions you have with your health care provider. Document Revised: 09/11/2020 Document Reviewed: 09/11/2020 Elsevier Patient Education  2022 [employer].     If you have been instructed to have an in-person  evaluation today at a local Urgent Care facility, please use the link below. It will take you to a list of all of our available [facility], including address, phone number and hours of operation.  Please do not delay care.  [facility]  If you or a family member do not have a primary care provider, use the link below to schedule a visit and establish care. When you choose a [facility] primary care physician or advanced practice provider, you gain a long-term partner in health. Find a Primary Care Provider  Learn more about [facility]'s in-office and virtual care options: [facility] - Get Care Now

## 2022-12-02 NOTE — Progress Notes (Signed)
Virtual Visit Consent   Laura Gilmore, you are scheduled for a virtual visit with a [facility] provider today. Just as with appointments in the office, your consent must be obtained to participate. Your consent will be active for this visit and any virtual visit you may have with one of our providers in the next 365 days. If you have a MyChart account, a copy of this consent can be sent to you electronically.  As this is a virtual visit, video technology does not allow for your provider to perform a traditional examination. This may limit your provider's ability to fully assess your condition. If your provider identifies any concerns that need to be evaluated in person or the need to arrange testing (such as labs, EKG, etc.), we will make arrangements to do so. Although advances in technology are sophisticated, we cannot ensure that it will always work on either your end or our end. If the connection with a video visit is poor, the visit may have to be switched to a telephone visit. With either a video or telephone visit, we are not always able to ensure that we have a secure connection.  By engaging in this virtual visit, you consent to the provision of healthcare and authorize for your insurance to be billed (if applicable) for the services provided during this visit. Depending on your insurance coverage, you may receive a charge related to this service.  I need to obtain your verbal consent now. Are you willing to proceed with your visit today? Laura Gilmore has provided verbal consent on 12/02/2022 for a virtual visit (video or telephone). Piedad Climes, [place]  Date: 12/02/2022 11:14 AM  Virtual Visit via Video Note   I, Piedad Climes, connected with  Laura Gilmore  ([id number], August 19, 1965) on 12/02/22 at 11:00 AM EDT by a video-enabled telemedicine application and verified that I am speaking with the correct person using two identifiers.  Location: Patient: Virtual  Visit Location Patient: Home Provider: Virtual Visit Location Provider: Home Office   I discussed the limitations of evaluation and management by telemedicine and the availability of in person appointments. The patient expressed understanding and agreed to proceed.    History of Present Illness: Laura Gilmore is a 57 y.o. who identifies as a female who was assigned female at birth, and is being seen today for COVID-19. Symptoms starting Monday on return from cruise with bad cough -- persistent and productive of clear phlegm, with onset of HA, low-grade fever, anorexia and fatigue. Took expired COVID test Tuesday morning that was positive. Retested with in-date test yesterday that was also positive. Denies chest pain or SOB.  Denies GI symptoms.    OTC -- Taken Tylenol and Advil. Mucinex for cough.    HPI: HPI  Problems:  Patient Active Problem List   Diagnosis Date Noted   Hypothyroidism 07/13/2022   Arthralgia of both hands 07/13/2022   Type 2 diabetes mellitus without complication, without long-term current use of insulin (HCC) 01/05/2018   Hyperlipidemia associated with type 2 diabetes mellitus (HCC) 04/09/2015    Allergies: No Known Allergies Medications:  Current Outpatient Medications:    benzonatate (TESSALON) 100 MG capsule, Take 1 capsule (100 mg total) by mouth 3 (three) times daily as needed for cough., Disp: 30 capsule, Rfl: 0   nirmatrelvir/ritonavir (PAXLOVID) 20 x 150 MG & 10 x 100MG  TABS, Take 3 tablets by mouth 2 (two) times daily for 5 days. (Take nirmatrelvir 150 mg two tablets  twice daily for 5 days and ritonavir 100 mg one tablet twice daily for 5 days) Patient GFR is > 60, Disp: 30 tablet, Rfl: 0   aspirin EC 81 MG tablet, Take 81 mg by mouth daily. , Disp: , Rfl:    atorvastatin (LIPITOR) 40 MG tablet, Take 40 mg by mouth daily., Disp: , Rfl:    levonorgestrel (LILETTA) 19.5 MCG/DAY IUD IUD, 1 Intra Uterine Device by Intrauterine route once., Disp: , Rfl:     levothyroxine (SYNTHROID) 50 MCG tablet, Take 50 mcg by mouth daily. Take one tablet by mouth on an empty stomach with a glass of water at least 30-60 minutes before breakfast, Disp: , Rfl:    meloxicam (MOBIC) 15 MG tablet, TAKE 1 TABLET (15 MG TOTAL) BY MOUTH DAILY., Disp: 30 tablet, Rfl: 1   metFORMIN (GLUCOPHAGE-XR) 500 MG 24 hr tablet, SMARTSIG:1 Tablet(s) By Mouth Every Evening, Disp: , Rfl:    Semaglutide, 1 MG/DOSE, (OZEMPIC, 1 MG/DOSE,) 4 MG/3ML SOPN, INJECT 1 MG ONCE A WEEK AS DIRECTED, Disp: 3 mL, Rfl: 3   sucralfate (CARAFATE) 1 g tablet, Take 1 tablet (1 g total) by mouth 4 (four) times daily -  with meals and at bedtime. Take as needed for acid reflux, Disp: 30 tablet, Rfl: 0   traZODone (DESYREL) 50 MG tablet, TAKE 1 TABLET BY MOUTH EVERY DAY AT NIGHT (Patient not taking: Reported on 10/13/2022), Disp: , Rfl:   Observations/Objective: Patient is well-developed, well-nourished in no acute distress.  Resting comfortably  at home.  Head is normocephalic, atraumatic.  No labored breathing.  Speech is clear and coherent with logical content.  Patient is alert and oriented at baseline.    Assessment and Plan: 1. COVID-19 - benzonatate (TESSALON) 100 MG capsule; Take 1 capsule (100 mg total) by mouth 3 (three) times daily as needed for cough.  Dispense: 30 capsule; Refill: 0 - nirmatrelvir/ritonavir (PAXLOVID) 20 x 150 MG & 10 x 100MG  TABS; Take 3 tablets by mouth 2 (two) times daily for 5 days. (Take nirmatrelvir 150 mg two tablets twice daily for 5 days and ritonavir 100 mg one tablet twice daily for 5 days) Patient GFR is > 60  Dispense: 30 tablet; Refill: 0  Patient with multiple risk factors for complicated course of illness. Discussed risks/benefits of antiviral medications including most common potential ADRs. Patient voiced understanding and would like to proceed with antiviral medication. They are candidate for Paxlovid. Hold Atorvastatin while taking antiviral. Rx sent to  pharmacy. Supportive measures, OTC medications and vitamin regimen reviewed. Tessalon per orders. Quarantine reviewed in detail. Strict ER precautions discussed with patient.    Follow Up Instructions: I discussed the assessment and treatment plan with the patient. The patient was provided an opportunity to ask questions and all were answered. The patient agreed with the plan and demonstrated an understanding of the instructions.  A copy of instructions were sent to the patient via MyChart unless otherwise noted below.   The patient was advised to call back or seek an in-person evaluation if the symptoms worsen or if the condition fails to improve as anticipated.  Time:  I spent 10 minutes with the patient via telehealth technology discussing the above problems/concerns.    Piedad Climes, PA-C

## 2022-12-06 ENCOUNTER — Telehealth: Payer: 59 | Admitting: Physician Assistant

## 2022-12-06 DIAGNOSIS — J208 Acute bronchitis due to other specified organisms: Secondary | ICD-10-CM

## 2022-12-06 DIAGNOSIS — B9689 Other specified bacterial agents as the cause of diseases classified elsewhere: Secondary | ICD-10-CM

## 2022-12-06 DIAGNOSIS — B999 Unspecified infectious disease: Secondary | ICD-10-CM | POA: Diagnosis not present

## 2022-12-06 DIAGNOSIS — U071 COVID-19: Secondary | ICD-10-CM | POA: Diagnosis not present

## 2022-12-06 MED ORDER — AZITHROMYCIN 250 MG PO TABS
ORAL_TABLET | ORAL | 0 refills | Status: AC
Start: 2022-12-06 — End: 2022-12-11

## 2022-12-06 NOTE — Patient Instructions (Signed)
Laura Gilmore, thank you for joining [name], PA-C for today's virtual visit.  While this provider is not your primary care provider (PCP), if your PCP is located in our provider database this encounter information will be shared with them immediately following your visit.   A [facility] MyChart account gives you access to today's visit and all your visits, tests, and labs performed at [facility] " click here if you don't have a [facility] MyChart account or go to [email or web address]  Consent: (Patient) Laura Gilmore provided verbal consent for this virtual visit at the beginning of the encounter.  Current Medications:  Current Outpatient Medications:    azithromycin (ZITHROMAX) 250 MG tablet, Take 2 tablets on day 1, then 1 tablet daily on days 2 through 5, Disp: 6 tablet, Rfl: 0   aspirin EC 81 MG tablet, Take 81 mg by mouth daily. , Disp: , Rfl:    atorvastatin (LIPITOR) 40 MG tablet, Take 40 mg by mouth daily., Disp: , Rfl:    benzonatate (TESSALON) 100 MG capsule, Take 1 capsule (100 mg total) by mouth 3 (three) times daily as needed for cough., Disp: 30 capsule, Rfl: 0   levonorgestrel (LILETTA) 19.5 MCG/DAY IUD IUD, 1 Intra Uterine Device by Intrauterine route once., Disp: , Rfl:    levothyroxine (SYNTHROID) 50 MCG tablet, Take 50 mcg by mouth daily. Take one tablet by mouth on an empty stomach with a glass of water at least 30-60 minutes before breakfast, Disp: , Rfl:    meloxicam (MOBIC) 15 MG tablet, TAKE 1 TABLET (15 MG TOTAL) BY MOUTH DAILY., Disp: 30 tablet, Rfl: 1   metFORMIN (GLUCOPHAGE-XR) 500 MG 24 hr tablet, SMARTSIG:1 Tablet(s) By Mouth Every Evening, Disp: , Rfl:    nirmatrelvir/ritonavir (PAXLOVID) 20 x 150 MG & 10 x 100MG  TABS, Take 3 tablets by mouth 2 (two) times daily for 5 days. (Take nirmatrelvir 150 mg two tablets twice daily for 5 days and ritonavir 100 mg one tablet twice daily for 5 days) Patient GFR is > 60, Disp: 30  tablet, Rfl: 0   Semaglutide, 1 MG/DOSE, (OZEMPIC, 1 MG/DOSE,) 4 MG/3ML SOPN, INJECT 1 MG ONCE A WEEK AS DIRECTED, Disp: 3 mL, Rfl: 3   sucralfate (CARAFATE) 1 g tablet, Take 1 tablet (1 g total) by mouth 4 (four) times daily -  with meals and at bedtime. Take as needed for acid reflux, Disp: 30 tablet, Rfl: 0   traZODone (DESYREL) 50 MG tablet, TAKE 1 TABLET BY MOUTH EVERY DAY AT NIGHT (Patient not taking: Reported on 10/13/2022), Disp: , Rfl:    Medications ordered in this encounter:  Meds ordered this encounter  Medications   azithromycin (ZITHROMAX) 250 MG tablet    Sig: Take 2 tablets on day 1, then 1 tablet daily on days 2 through 5    Dispense:  6 tablet    Refill:  0    Order Specific Question:   Supervising Provider    Answer:   Merrilee Jansky [id number]     *If you need refills on other medications prior to your next appointment, please contact your pharmacy*  Follow-Up: Call back or seek an in-person evaluation if the symptoms worsen or if the condition fails to improve as anticipated.  [facility] [phone]  Care Instructions: Can take to lessen severity: Vit C 500mg  twice daily Quercertin 250-500mg  twice daily Zinc 75-100mg  daily Melatonin 3-6 mg at bedtime Vit D3 1000-2000 IU daily Optional: Famotidine  20mg  daily May add Mucinex or Mucinex DM as needed for cough/congestion    Isolation Instructions: You are to isolate at home until you have been fever free for at least 24 hours without a fever-reducing medication, and symptoms have been steadily improving for 24 hours. At that time,  you can end isolation but need to mask for an additional 5 days.   If you must be around other household members who do not have symptoms, you need to make sure that both you and the family members are masking consistently with a high-quality mask.  If you note any worsening of symptoms despite treatment, please seek an in-person evaluation ASAP. If you note any  significant shortness of breath or any chest pain, please seek ER evaluation. Please do not delay care!   COVID-19: What to Do if You Are Sick If you test positive and are an older adult or someone who is at high risk of getting very sick from COVID-19, treatment may be available. Contact a healthcare provider right away after a positive test to determine if you are eligible, even if your symptoms are mild right now. You can also visit a Test to Treat location and, if eligible, receive a prescription from a provider. Don't delay: Treatment must be started within the first few days to be effective. If you have a fever, cough, or other symptoms, you might have COVID-19. Most people have mild illness and are able to recover at home. If you are sick: Keep track of your symptoms. If you have an emergency warning sign (including trouble breathing), call 911. Steps to help prevent the spread of COVID-19 if you are sick If you are sick with COVID-19 or think you might have COVID-19, follow the steps below to care for yourself and to help protect other people in your home and community. Stay home except to get medical care Stay home. Most people with COVID-19 have mild illness and can recover at home without medical care. Do not leave your home, except to get medical care. Do not visit public areas and do not go to places where you are unable to wear a mask. Take care of yourself. Get rest and stay hydrated. Take over-the-counter medicines, such as acetaminophen, to help you feel better. Stay in touch with your doctor. Call before you get medical care. Be sure to get care if you have trouble breathing, or have any other emergency warning signs, or if you think it is an emergency. Avoid public transportation, ride-sharing, or taxis if possible. Get tested If you have symptoms of COVID-19, get tested. While waiting for test results, stay away from others, including staying apart from those living in your  household. Get tested as soon as possible after your symptoms start. Treatments may be available for people with COVID-19 who are at risk for becoming very sick. Don't delay: Treatment must be started early to be effective--some treatments must begin within 5 days of your first symptoms. Contact your healthcare provider right away if your test result is positive to determine if you are eligible. Self-tests are one of several options for testing for the virus that causes COVID-19 and may be more convenient than laboratory-based tests and point-of-care tests. Ask your healthcare provider or your local health department if you need help interpreting your test results. You can visit your state, tribal, local, and territorial health department's website to look for the latest local information on testing sites. Separate yourself from other people As much as  possible, stay in a specific room and away from other people and pets in your home. If possible, you should use a separate bathroom. If you need to be around other people or animals in or outside of the home, wear a well-fitting mask. Tell your close contacts that they may have been exposed to COVID-19. An infected person can spread COVID-19 starting 48 hours (or 2 days) before the person has any symptoms or tests positive. By letting your close contacts know they may have been exposed to COVID-19, you are helping to protect everyone. See COVID-19 and Animals if you have questions about pets. If you are diagnosed with COVID-19, someone from the health department may call you. Answer the call to slow the spread. Monitor your symptoms Symptoms of COVID-19 include fever, cough, or other symptoms. Follow care instructions from your healthcare provider and local health department. Your local health authorities may give instructions on checking your symptoms and reporting information. When to seek emergency medical attention Look for emergency warning signs*  for COVID-19. If someone is showing any of these signs, seek emergency medical care immediately: Trouble breathing Persistent pain or pressure in the chest New confusion Inability to wake or stay awake Pale, gray, or blue-colored skin, lips, or nail beds, depending on skin tone *This list is not all possible symptoms. Please call your medical provider for any other symptoms that are severe or concerning to you. Call 911 or call ahead to your local emergency facility: Notify the operator that you are seeking care for someone who has or may have COVID-19. Call ahead before visiting your doctor Call ahead. Many medical visits for routine care are being postponed or done by phone or telemedicine. If you have a medical appointment that cannot be postponed, call your doctor's office, and tell them you have or may have COVID-19. This will help the office protect themselves and other patients. If you are sick, wear a well-fitting mask You should wear a mask if you must be around other people or animals, including pets (even at home). Wear a mask with the best fit, protection, and comfort for you. You don't need to wear the mask if you are alone. If you can't put on a mask (because of trouble breathing, for example), cover your coughs and sneezes in some other way. Try to stay at least 6 feet away from other people. This will help protect the people around you. Masks should not be placed on young children under age 47 years, anyone who has trouble breathing, or anyone who is not able to remove the mask without help. Cover your coughs and sneezes Cover your mouth and nose with a tissue when you cough or sneeze. Throw away used tissues in a lined trash can. Immediately wash your hands with soap and water for at least 20 seconds. If soap and water are not available, clean your hands with an alcohol-based hand sanitizer that contains at least 60% alcohol. Clean your hands often Wash your hands often with soap  and water for at least 20 seconds. This is especially important after blowing your nose, coughing, or sneezing; going to the bathroom; and before eating or preparing food. Use hand sanitizer if soap and water are not available. Use an alcohol-based hand sanitizer with at least 60% alcohol, covering all surfaces of your hands and rubbing them together until they feel dry. Soap and water are the best option, especially if hands are visibly dirty. Avoid touching your eyes, nose, and mouth  with unwashed hands. Handwashing Tips Avoid sharing personal household items Do not share dishes, drinking glasses, cups, eating utensils, towels, or bedding with other people in your home. Wash these items thoroughly after using them with soap and water or put in the dishwasher. Clean surfaces in your home regularly Clean and disinfect high-touch surfaces (for example, doorknobs, tables, handles, light switches, and countertops) in your "sick room" and bathroom. In shared spaces, you should clean and disinfect surfaces and items after each use by the person who is ill. If you are sick and cannot clean, a caregiver or other person should only clean and disinfect the area around you (such as your bedroom and bathroom) on an as needed basis. Your caregiver/other person should wait as long as possible (at least several hours) and wear a mask before entering, cleaning, and disinfecting shared spaces that you use. Clean and disinfect areas that may have blood, stool, or body fluids on them. Use household cleaners and disinfectants. Clean visible dirty surfaces with household cleaners containing soap or detergent. Then, use a household disinfectant. Use a product from [employer] List N: Disinfectants for Coronavirus (COVID-19). Be sure to follow the instructions on the label to ensure safe and effective use of the product. Many products recommend keeping the surface wet with a disinfectant for a certain period of time (look at  "contact time" on the product label). You may also need to wear personal protective equipment, such as gloves, depending on the directions on the product label. Immediately after disinfecting, wash your hands with soap and water for 20 seconds. For completed guidance on cleaning and disinfecting your home, visit Complete Disinfection Guidance. Take steps to improve ventilation at home Improve ventilation (air flow) at home to help prevent from spreading COVID-19 to other people in your household. Clear out COVID-19 virus particles in the air by opening windows, using air filters, and turning on fans in your home. Use this interactive tool to learn how to improve air flow in your home. When you can be around others after being sick with COVID-19 Deciding when you can be around others is different for different situations. Find out when you can safely end home isolation. For any additional questions about your care, contact your healthcare provider or state or local health department. 07/29/2020 Content source: [facility]ization and [facility] Diseases ([facility]), Division of Viral Diseases This information is not intended to replace advice given to you by your health care provider. Make sure you discuss any questions you have with your health care provider. Document Revised: 09/11/2020 Document Reviewed: 09/11/2020 Elsevier Patient Education  2022 [employer].     If you have been instructed to have an in-person evaluation today at a local Urgent Care facility, please use the link below. It will take you to a list of all of our available [facility], including address, phone number and hours of operation. Please do not delay care.  [facility]  If you or a family member do not have a primary care provider, use the link below to schedule a visit and establish care. When you choose a [facility] primary care physician or advanced practice provider, you  gain a long-term partner in health. Find a Primary Care Provider  Learn more about [facility]'s in-office and virtual care options: [facility] - Get Care Now

## 2022-12-06 NOTE — Progress Notes (Signed)
Virtual Visit Consent   Laura Gilmore, you are scheduled for a virtual visit with a [facility] provider today. Just as with appointments in the office, your consent must be obtained to participate. Your consent will be active for this visit and any virtual visit you may have with one of our providers in the next 365 days. If you have a MyChart account, a copy of this consent can be sent to you electronically.  As this is a virtual visit, video technology does not allow for your provider to perform a traditional examination. This may limit your provider's ability to fully assess your condition. If your provider identifies any concerns that need to be evaluated in person or the need to arrange testing (such as labs, EKG, etc.), we will make arrangements to do so. Although advances in technology are sophisticated, we cannot ensure that it will always work on either your end or our end. If the connection with a video visit is poor, the visit may have to be switched to a telephone visit. With either a video or telephone visit, we are not always able to ensure that we have a secure connection.  By engaging in this virtual visit, you consent to the provision of healthcare and authorize for your insurance to be billed (if applicable) for the services provided during this visit. Depending on your insurance coverage, you may receive a charge related to this service.  I need to obtain your verbal consent now. Are you willing to proceed with your visit today? Laura Gilmore has provided verbal consent on 12/06/2022 for a virtual visit (video or telephone). Laura Loveless, PA-C  Date: 12/06/2022 8:53 AM  Virtual Visit via Video Note   I, Laura Gilmore, connected with  Laura Gilmore  ([id number], 1965-05-16) on 12/06/22 at  8:15 AM EDT by a video-enabled telemedicine application and verified that I am speaking with the correct person using two identifiers.  Location: Patient: Virtual  Visit Location Patient: Home Provider: Virtual Visit Location Provider: Home Office   I discussed the limitations of evaluation and management by telemedicine and the availability of in person appointments. The patient expressed understanding and agreed to proceed.    History of Present Illness: Laura Gilmore is a 57 y.o. who identifies as a female who was assigned female at birth, and is being seen today for Covid 29. Seen Virtually on Thursday, 12/02/22 and started Paxlovid. She started Paxlovid Friday, 12/03/22. Cough has improved some, but still has frequently. Her biggest concern is that she is still having increased fatigue, wants to sleep a lot, decreased appetite. She reports she has not really eaten much since Thursday. She has been drinking water.     Problems:  Patient Active Problem List   Diagnosis Date Noted   Hypothyroidism 07/13/2022   Arthralgia of both hands 07/13/2022   Type 2 diabetes mellitus without complication, without long-term current use of insulin (HCC) 01/05/2018   Hyperlipidemia associated with type 2 diabetes mellitus (HCC) 04/09/2015    Allergies: No Known Allergies Medications:  Current Outpatient Medications:    azithromycin (ZITHROMAX) 250 MG tablet, Take 2 tablets on day 1, then 1 tablet daily on days 2 through 5, Disp: 6 tablet, Rfl: 0   aspirin EC 81 MG tablet, Take 81 mg by mouth daily. , Disp: , Rfl:    atorvastatin (LIPITOR) 40 MG tablet, Take 40 mg by mouth daily., Disp: , Rfl:    benzonatate (TESSALON) 100 MG capsule, Take  1 capsule (100 mg total) by mouth 3 (three) times daily as needed for cough., Disp: 30 capsule, Rfl: 0   levonorgestrel (LILETTA) 19.5 MCG/DAY IUD IUD, 1 Intra Uterine Device by Intrauterine route once., Disp: , Rfl:    levothyroxine (SYNTHROID) 50 MCG tablet, Take 50 mcg by mouth daily. Take one tablet by mouth on an empty stomach with a glass of water at least 30-60 minutes before breakfast, Disp: , Rfl:    meloxicam  (MOBIC) 15 MG tablet, TAKE 1 TABLET (15 MG TOTAL) BY MOUTH DAILY., Disp: 30 tablet, Rfl: 1   metFORMIN (GLUCOPHAGE-XR) 500 MG 24 hr tablet, SMARTSIG:1 Tablet(s) By Mouth Every Evening, Disp: , Rfl:    nirmatrelvir/ritonavir (PAXLOVID) 20 x 150 MG & 10 x 100MG  TABS, Take 3 tablets by mouth 2 (two) times daily for 5 days. (Take nirmatrelvir 150 mg two tablets twice daily for 5 days and ritonavir 100 mg one tablet twice daily for 5 days) Patient GFR is > 60, Disp: 30 tablet, Rfl: 0   Semaglutide, 1 MG/DOSE, (OZEMPIC, 1 MG/DOSE,) 4 MG/3ML SOPN, INJECT 1 MG ONCE A WEEK AS DIRECTED, Disp: 3 mL, Rfl: 3   sucralfate (CARAFATE) 1 g tablet, Take 1 tablet (1 g total) by mouth 4 (four) times daily -  with meals and at bedtime. Take as needed for acid reflux, Disp: 30 tablet, Rfl: 0   traZODone (DESYREL) 50 MG tablet, TAKE 1 TABLET BY MOUTH EVERY DAY AT NIGHT (Patient not taking: Reported on 10/13/2022), Disp: , Rfl:   Observations/Objective: Patient is well-developed, well-nourished in no acute distress.  Resting comfortably at home.  Head is normocephalic, atraumatic.  No labored breathing.  Speech is clear and coherent with logical content.  Patient is alert and oriented at baseline.    Assessment and Plan: 1. Acute bacterial bronchitis - azithromycin (ZITHROMAX) 250 MG tablet; Take 2 tablets on day 1, then 1 tablet daily on days 2 through 5  Dispense: 6 tablet; Refill: 0  2. COVID-19  3. Superimposed infection  - Continued Covid 19 symptoms  - Will add Zpack in case of secondary bacterial infection - Advised to push fluids and increase food intake, can do protein shakes if needed - Add Vit D, Vit C, Zinc - Work note extended - Seek in person evaluation if not improving  Follow Up Instructions: I discussed the assessment and treatment plan with the patient. The patient was provided an opportunity to ask questions and all were answered. The patient agreed with the plan and demonstrated an  understanding of the instructions.  A copy of instructions were sent to the patient via MyChart unless otherwise noted below.    The patient was advised to call back or seek an in-person evaluation if the symptoms worsen or if the condition fails to improve as anticipated.  Time:  I spent 12 minutes with the patient via telehealth technology discussing the above problems/concerns.    Laura Loveless, PA-C

## 2022-12-27 ENCOUNTER — Telehealth: Payer: Self-pay | Admitting: Family Medicine

## 2022-12-27 NOTE — Telephone Encounter (Signed)
Patient dropped off form to be completed for [facility]. It is being placed in message box.   Please complete and fax to # on form.

## 2022-12-28 ENCOUNTER — Other Ambulatory Visit: Payer: Self-pay | Admitting: Family Medicine

## 2022-12-30 NOTE — Telephone Encounter (Signed)
Just an FYI

## 2023-01-06 NOTE — Telephone Encounter (Signed)
Patient has been scheduled for 01/17/2023 with Dr Jacquenette Shone.

## 2023-01-06 NOTE — Telephone Encounter (Signed)
Left vm for pt to call back to schedule an appt.  Okay for [facility] to schedule pt appt for cpe per Dr. Rexanne Mano message.

## 2023-01-17 ENCOUNTER — Ambulatory Visit (INDEPENDENT_AMBULATORY_CARE_PROVIDER_SITE_OTHER): Payer: 59 | Admitting: Family Medicine

## 2023-01-17 ENCOUNTER — Ambulatory Visit: Payer: 59 | Admitting: Family Medicine

## 2023-01-17 VITALS — BP 99/55 | HR 65 | Temp 98.8°F | Ht 61.0 in | Wt 224.0 lb

## 2023-01-17 DIAGNOSIS — E039 Hypothyroidism, unspecified: Secondary | ICD-10-CM | POA: Diagnosis not present

## 2023-01-17 DIAGNOSIS — Z0001 Encounter for general adult medical examination with abnormal findings: Secondary | ICD-10-CM | POA: Diagnosis not present

## 2023-01-17 DIAGNOSIS — E1169 Type 2 diabetes mellitus with other specified complication: Secondary | ICD-10-CM | POA: Diagnosis not present

## 2023-01-17 DIAGNOSIS — Z7984 Long term (current) use of oral hypoglycemic drugs: Secondary | ICD-10-CM

## 2023-01-17 DIAGNOSIS — E1122 Type 2 diabetes mellitus with diabetic chronic kidney disease: Secondary | ICD-10-CM | POA: Diagnosis not present

## 2023-01-17 DIAGNOSIS — E785 Hyperlipidemia, unspecified: Secondary | ICD-10-CM

## 2023-01-17 DIAGNOSIS — N182 Chronic kidney disease, stage 2 (mild): Secondary | ICD-10-CM

## 2023-01-17 DIAGNOSIS — Z0000 Encounter for general adult medical examination without abnormal findings: Secondary | ICD-10-CM

## 2023-01-17 NOTE — Progress Notes (Signed)
Complete physical exam   Patient: Laura Gilmore   DOB: [birth year]   57 y.o. Female  MRN: [id number] Visit Date: 01/17/2023  Today's healthcare provider: Sherlyn Hay, DO   Chief Complaint  Patient presents with   Annual Exam   Subjective    Laura Gilmore is a 57 y.o. female who presents today for a complete physical exam.  She reports consuming a general diet and with focus on chicken, fish and veggies.  Follows exercises by a lady on YouTube who does programs for ladies over 5 years old. It has focus on building muscles as cardiovascular health.  She generally feels well. She reports sleeping well. She does not have additional problems to discuss today.  HPI   Ozempic is really helping; has lost more weigh  - doesn't have nausea, but does experience indigestion and bad diarrhea if she doesn't stick with healthier foods.  Had eye exam in March 2024 at [facility]   Past Medical History:  Diagnosis Date   Arthritis    GERD (gastroesophageal reflux disease)    Hypercholesterolemia    Hypothyroidism    Past Surgical History:  Procedure Laterality Date   CESAREAN SECTION  2006   COLONOSCOPY WITH PROPOFOL N/A 03/24/2018   Procedure: COLONOSCOPY WITH PROPOFOL;  Surgeon: Christena Deem, MD;  Location: [facility] ENDOSCOPY;  Service: Endoscopy;  Laterality: N/A;   EYE SURGERY  2009   Lasik   HYSTEROSCOPY WITH D & C N/A 10/14/2017   Procedure: DILATATION AND CURETTAGE /HYSTEROSCOPY;  Surgeon: Ward, Elenora Fender, MD;  Location: [facility] ORS;  Service: Gynecology;  Laterality: N/A;   INTRAUTERINE DEVICE (IUD) INSERTION N/A 10/14/2017   Procedure: INTRAUTERINE DEVICE (IUD) INSERTION;  Surgeon: Ward, Elenora Fender, MD;  Location: [facility] ORS;  Service: Gynecology;  Laterality: N/A;   POLYPECTOMY N/A 10/14/2017   Procedure: POLYPECTOMY;  Surgeon: Ward, Elenora Fender, MD;  Location: [facility] ORS;  Service: Gynecology;  Laterality: N/A;   REFRACTIVE SURGERY Bilateral    Social  History   Socioeconomic History   Marital status: Married    Spouse name: Not on file   Number of children: Not on file   Years of education: Not on file   Highest education level: Bachelor's degree (e.g., BA, AB, BS)  Occupational History   Not on file  Tobacco Use   Smoking status: Former    Current packs/day: 0.00    Types: Cigarettes    Quit date: 04/10/1999    Years since quitting: 23.8   Smokeless tobacco: Never  Vaping Use   Vaping status: Never Used  Substance and Sexual Activity   Alcohol use: Not Currently    Comment: rare   Drug use: Never   Sexual activity: Yes    Birth control/protection: I.U.D.  Other Topics Concern   Not on file  Social History Narrative   Not on file   Social Determinants of Health   Financial Resource Strain: Low Risk  (10/13/2022)   Overall Financial Resource Strain (CARDIA)    Difficulty of Paying Living Expenses: Not hard at all  Food Insecurity: No Food Insecurity (10/13/2022)   Hunger Vital Sign    Worried About Running Out of Food in the Last Year: Never true    Ran Out of Food in the Last Year: Never true  Transportation Needs: No Transportation Needs (10/13/2022)   PRAPARE - Administrator, Civil Service (Medical): No    Lack of Transportation (Non-Medical): No  Physical Activity: Insufficiently Active (10/13/2022)   Exercise Vital Sign    Days of Exercise per Week: 3 days    Minutes of Exercise per Session: 20 min  Stress: No Stress Concern Present (10/13/2022)   [employer] of Occupational Health - Occupational Stress Questionnaire    Feeling of Stress : Not at all  Social Connections: Socially Integrated (10/13/2022)   Social Connection and Isolation Panel [NHANES]    Frequency of Communication with Friends and Family: More than three times a week    Frequency of Social Gatherings with Friends and Family: Twice a week    Attends Religious Services: More than 4 times per year    Active Member of [employer] or  Organizations: Yes    Attends Engineer, structural: More than 4 times per year    Marital Status: Married  Catering manager Violence: Not on file   Family Status  Relation Name Status   Mother Docia Chuck Deceased   Father Brooke Bonito Deceased   Sister Chrissie Noa (Not Specified)   Neg Hx  (Not Specified)  No partnership data on file   Family History  Problem Relation Age of Onset   Cancer Mother    Arthritis Mother    Heart attack Father    Stroke Father    Diabetes Father    Kidney disease Sister    Breast cancer Neg Hx    No Known Allergies  Patient Care Team: Rhyse Loux N, DO as PCP - General (Family Medicine)   Medications: Outpatient Medications Prior to Visit  Medication Sig   aspirin EC 81 MG tablet Take 81 mg by mouth daily.    atorvastatin (LIPITOR) 40 MG tablet TAKE 1 TABLET BY MOUTH EVERY DAY   levonorgestrel (LILETTA) 19.5 MCG/DAY IUD IUD 1 Intra Uterine Device by Intrauterine route once.   levothyroxine (SYNTHROID) 50 MCG tablet TAKE 1 TAB ONCE DAILY ON EMPTY STOMACH WITH A GLASS OF WATER AT LEAST 30-60 MINUTES BEFORE BREAKFAST   meloxicam (MOBIC) 15 MG tablet TAKE 1 TABLET (15 MG TOTAL) BY MOUTH DAILY.   metFORMIN (GLUCOPHAGE-XR) 500 MG 24 hr tablet TAKE 1 TABLET BY MOUTH EVERY DAY WITH DINNER   Semaglutide, 1 MG/DOSE, (OZEMPIC, 1 MG/DOSE,) 4 MG/3ML SOPN INJECT 1 MG ONCE A WEEK AS DIRECTED   sucralfate (CARAFATE) 1 g tablet Take 1 tablet (1 g total) by mouth 4 (four) times daily -  with meals and at bedtime. Take as needed for acid reflux   traZODone (DESYREL) 50 MG tablet    [DISCONTINUED] benzonatate (TESSALON) 100 MG capsule Take 1 capsule (100 mg total) by mouth 3 (three) times daily as needed for cough.   No facility-administered medications prior to visit.    Review of Systems  Constitutional:  Negative for chills, fatigue and fever.  HENT:  Negative for congestion, ear pain, rhinorrhea, sneezing and sore throat.   Eyes: Negative.   Negative for pain and redness.  Respiratory:  Negative for cough, shortness of breath and wheezing.   Cardiovascular:  Negative for chest pain and leg swelling.  Gastrointestinal:  Negative for abdominal pain, blood in stool, constipation, diarrhea and nausea.       +indigestion  Endocrine: Negative for polydipsia and polyphagia.  Genitourinary: Negative.  Negative for dysuria, flank pain, hematuria, pelvic pain, vaginal bleeding and vaginal discharge.  Musculoskeletal:  Negative for arthralgias, back pain, gait problem and joint swelling.  Skin:  Negative for rash.  Neurological: Negative.  Negative for dizziness, tremors, seizures,  weakness, light-headedness, numbness and headaches.  Hematological:  Negative for adenopathy.  Psychiatric/Behavioral: Negative.  Negative for behavioral problems, confusion and dysphoric mood. The patient is not nervous/anxious and is not hyperactive.       Objective    BP (!) 99/55 (BP Location: Left Arm, Patient Position: Sitting, Cuff Size: Large)   Pulse 65   Temp 98.8 F (37.1 C) (Oral)   Ht 5\' 1"  (1.549 m)   Wt 224 lb (101.6 kg)   SpO2 100%   BMI 42.32 kg/m    Physical Exam Vitals and nursing note reviewed.  Constitutional:      General: She is awake.     Appearance: Normal appearance.  HENT:     Head: Normocephalic and atraumatic.     Right Ear: Tympanic membrane, ear canal and external ear normal.     Left Ear: Tympanic membrane, ear canal and external ear normal.     Nose: Nose normal.     Mouth/Throat:     Mouth: Mucous membranes are moist.     Pharynx: Oropharynx is clear. No oropharyngeal exudate or posterior oropharyngeal erythema.  Eyes:     General: No scleral icterus.    Extraocular Movements: Extraocular movements intact.     Conjunctiva/sclera: Conjunctivae normal.     Pupils: Pupils are equal, round, and reactive to light.  Neck:     Thyroid: No thyromegaly or thyroid tenderness.  Cardiovascular:     Rate and Rhythm:  Normal rate and regular rhythm.     Pulses: Normal pulses.     Heart sounds: Normal heart sounds.  Pulmonary:     Effort: Pulmonary effort is normal. No tachypnea, bradypnea or respiratory distress.     Breath sounds: Normal breath sounds. No stridor. No wheezing, rhonchi or rales.  Abdominal:     General: Bowel sounds are normal. There is no distension.     Palpations: Abdomen is soft. There is no mass.     Tenderness: There is no abdominal tenderness. There is no guarding.     Hernia: No hernia is present.  Musculoskeletal:     Cervical back: Normal range of motion and neck supple.     Right lower leg: No edema.     Left lower leg: No edema.  Lymphadenopathy:     Cervical: No cervical adenopathy.  Skin:    General: Skin is Gilmore and dry.  Neurological:     Mental Status: She is alert and oriented to person, place, and time. Mental status is at baseline.  Psychiatric:        Mood and Affect: Mood normal.        Behavior: Behavior normal.       Last depression screening scores    01/17/2023    3:20 PM 07/13/2022    8:16 AM  PHQ 2/9 Scores  PHQ - 2 Score 0 0  PHQ- 9 Score 0 0   Last fall risk screening    01/17/2023    3:20 PM  Fall Risk   Falls in the past year? 0  Number falls in past yr: 0  Injury with Fall? 0   Last Audit-C alcohol use screening    10/13/2022    7:25 AM  Alcohol Use Disorder Test (AUDIT)  1. How often do you have a drink containing alcohol? 1  2. How many drinks containing alcohol do you have on a typical day when you are drinking? 0  3. How often do you have six or more  drinks on one occasion? 0  AUDIT-C Score 1   A score of 3 or more in women, and 4 or more in men indicates increased risk for alcohol abuse, EXCEPT if all of the points are from question 1     Assessment & Plan    Routine Health Maintenance and Physical Exam  Exercise Activities and Dietary recommendations  Goals   None     Immunization History  Administered Date(s)  Administered   Influenza Inj Mdck Quad Pf 01/24/2018   Influenza-Unspecified 02/12/2016   Tdap 01/07/2016   Zoster Recombinant(Shingrix) 01/24/2018, 06/01/2018    Health Maintenance  Topic Date Due   Cervical Cancer Screening (HPV/Pap Cotest)  Never done   INFLUENZA VACCINE  08/08/2023 (Originally 12/09/2022)   COVID-19 Vaccine (1 - 2023-24 season) 08/08/2023 (Originally 01/09/2023)   Hepatitis C Screening  01/17/2024 (Originally 09/12/1985)   HIV Screening  01/17/2024 (Originally 09/13/1982)   HEMOGLOBIN A1C  07/18/2023   OPHTHALMOLOGY EXAM  07/20/2023   FOOT EXAM  10/13/2023   MAMMOGRAM  10/21/2023   Diabetic kidney evaluation - eGFR measurement  01/18/2024   Diabetic kidney evaluation - Urine ACR  01/18/2024   DTaP/Tdap/Td (2 - Td or Tdap) 01/06/2026   Colonoscopy  03/24/2028   Zoster Vaccines- Shingrix  Completed   HPV VACCINES  Aged Out    Discussed health benefits of physical activity, and encouraged her to engage in regular exercise appropriate for her age and condition.   Annual physical exam Assessment & Plan: Physical exam overall unremarkable. Routine lab work ordered as noted below.   Filled out documentation online for patient's work/insurance verifying that she had come in and had her physical done.  Orders: -     Microalbumin / creatinine urine ratio -     Comprehensive metabolic panel -     Lipid panel -     TSH Rfx on Abnormal to Free T4  Hyperlipidemia associated with type 2 diabetes mellitus (HCC) Assessment & Plan: Patient doing well on atorvastatin 40 mg.  Denies side effects. Will recheck lipid panel today.  Orders: -     Hemoglobin A1c -     Lipid panel -     Vitamin B12  Type 2 diabetes mellitus with stage 2 chronic kidney disease, without long-term current use of insulin (HCC) Assessment & Plan: Patient doing well on Ozempic and metformin. Will recheck A1c today.  Orders: -     Hemoglobin A1c -     Vitamin B12  Hypothyroidism, unspecified  type Assessment & Plan: Stable on 50 mcg levothyroxine daily Will recheck TSH today. No acute concerns  Orders: -     TSH Rfx on Abnormal to Free T4    Return in about 4 months (around 05/19/2023) for DM, Weight.     I discussed the assessment and treatment plan with the patient  The patient was provided an opportunity to ask questions and all were answered. The patient agreed with the plan and demonstrated an understanding of the instructions.   The patient was advised to call back or seek an in-person evaluation if the symptoms worsen or if the condition fails to improve as anticipated.    Sherlyn Hay, DO  [facility] [phone] (phone) [phone] (fax)  [facility]

## 2023-01-17 NOTE — Patient Instructions (Signed)
Increase your intake of fresh/frozen fruits and vegetables.  The Mediterranean diet is a great reference for meal ideas.  I strongly encourage you to incorporate exercise into your daily routine (at least 30 minutes most days of the week) and monitor your dietary intake. - I encourage you to measure/weigh your foods/beverages for a few days to get a more accurate idea of what different amounts of things look like on your plate or in your glass.  - Using smaller plates/glasses also helps in that it tricks our minds into thinking we've eaten more than we have. Studies have shown that we eat more when presented with larger plates, even with the same amount of food on them.   - Plan to eat until you are no longer hungry, rather than until you're full.  Here are some videos which offer a variety of different workout classes with different levels: [email or web address].com/[email or web address]  It is completely free. If a full video is too much for you to do, you can do them in bite-sized chunks (just pausing when needed and resuming later in the day).

## 2023-01-19 LAB — LIPID PANEL
Chol/HDL Ratio: 3.6 ratio (ref 0.0–4.4)
Cholesterol, Total: 164 mg/dL (ref 100–199)
HDL: 46 mg/dL (ref >39)
LDL Chol Calc (NIH): 97 mg/dL (ref 0–99)
Triglycerides: 119 mg/dL (ref 0–149)
VLDL Cholesterol Cal: 21 mg/dL (ref 5–40)

## 2023-01-19 LAB — COMPREHENSIVE METABOLIC PANEL
ALT: 16 IU/L (ref 0–32)
AST: 19 IU/L (ref 0–40)
Albumin: 4.3 g/dL (ref 3.8–4.9)
Alkaline Phosphatase: 198 IU/L — ABNORMAL HIGH (ref 44–121)
BUN/Creatinine Ratio: 13 (ref 9–23)
BUN: 9 mg/dL (ref 6–24)
Bilirubin Total: 0.7 mg/dL (ref 0.0–1.2)
CO2: 21 mmol/L (ref 20–29)
Calcium: 10 mg/dL (ref 8.7–10.2)
Chloride: 103 mmol/L (ref 96–106)
Creatinine, Ser: 0.71 mg/dL (ref 0.57–1.00)
Globulin, Total: 2.9 g/dL (ref 1.5–4.5)
Glucose: 101 mg/dL — ABNORMAL HIGH (ref 70–99)
Potassium: 4.8 mmol/L (ref 3.5–5.2)
Sodium: 143 mmol/L (ref 134–144)
Total Protein: 7.2 g/dL (ref 6.0–8.5)
eGFR: 100 mL/min/{1.73_m2} (ref >59)

## 2023-01-19 LAB — MICROALBUMIN / CREATININE URINE RATIO
Creatinine, Urine: 44.4 mg/dL
Microalb/Creat Ratio: <7 mg/g{creat} (ref 0–29)
Microalbumin, Urine: <3 ug/mL

## 2023-01-19 LAB — VITAMIN B12: Vitamin B-12: 433 pg/mL (ref 232–1245)

## 2023-01-19 LAB — HEMOGLOBIN A1C
Est. average glucose Bld gHb Est-mCnc: 126 mg/dL
Hgb A1c MFr Bld: 6 % — ABNORMAL HIGH (ref 4.8–5.6)

## 2023-01-19 LAB — TSH RFX ON ABNORMAL TO FREE T4: TSH: 2.56 u[IU]/mL (ref 0.450–4.500)

## 2023-01-23 ENCOUNTER — Other Ambulatory Visit: Payer: Self-pay | Admitting: Family Medicine

## 2023-01-23 DIAGNOSIS — R748 Abnormal levels of other serum enzymes: Secondary | ICD-10-CM

## 2023-01-24 ENCOUNTER — Encounter: Payer: Self-pay | Admitting: Family Medicine

## 2023-01-24 DIAGNOSIS — Z0000 Encounter for general adult medical examination without abnormal findings: Secondary | ICD-10-CM | POA: Insufficient documentation

## 2023-01-24 MED ORDER — ROSUVASTATIN CALCIUM 40 MG PO TABS: 40.0000 mg | ORAL_TABLET | Freq: Every day | ORAL | 3 refills | Status: DC

## 2023-01-24 NOTE — Telephone Encounter (Signed)
Copied from CRM [id number]. Topic: General - Other >> Jan 24, 2023  1:39 PM Dominique A wrote: Reason for CRM: Pt is calling back returning Josie phone call, pt states that she is ok with her PCP changing her medication and would like for the new medication to be sent to: [id number] [place], [place] - [address]  Phone: [phone] Fax: [phone]

## 2023-01-24 NOTE — Addendum Note (Signed)
Addended by: Marjie Skiff on: 01/24/2023 04:29 PM   Modules accepted: Orders

## 2023-01-24 NOTE — Assessment & Plan Note (Signed)
Patient doing well on atorvastatin 40 mg.  Denies side effects. Will recheck lipid panel today.

## 2023-01-24 NOTE — Assessment & Plan Note (Signed)
Patient doing well on Ozempic and metformin. Will recheck A1c today.

## 2023-01-24 NOTE — Assessment & Plan Note (Addendum)
Physical exam overall unremarkable. Routine lab work ordered as noted below.   Filled out documentation online for patient's work/insurance verifying that she had come in and had her physical done.

## 2023-01-24 NOTE — Assessment & Plan Note (Signed)
Stable on 50 mcg levothyroxine daily Will recheck TSH today. No acute concerns

## 2023-02-14 ENCOUNTER — Ambulatory Visit: Payer: 59 | Admitting: Family Medicine

## 2023-02-17 ENCOUNTER — Other Ambulatory Visit: Payer: Self-pay | Admitting: Family Medicine

## 2023-02-17 DIAGNOSIS — E119 Type 2 diabetes mellitus without complications: Secondary | ICD-10-CM

## 2023-02-17 MED ORDER — OZEMPIC (1 MG/DOSE) 4 MG/3ML ~~LOC~~ SOPN
PEN_INJECTOR | SUBCUTANEOUS | 3 refills | Status: DC
Start: 2023-02-17 — End: 2023-04-12

## 2023-02-17 NOTE — Telephone Encounter (Signed)
[facility] faxed refill request for the following medications:   Semaglutide, 1 MG/DOSE, (OZEMPIC, 1 MG/DOSE,) 4 MG/3ML SOPN   Please advise.

## 2023-02-19 ENCOUNTER — Other Ambulatory Visit: Payer: Self-pay | Admitting: Family Medicine

## 2023-04-12 ENCOUNTER — Encounter: Payer: Self-pay | Admitting: Family Medicine

## 2023-04-12 ENCOUNTER — Other Ambulatory Visit: Payer: Self-pay | Admitting: Physician Assistant

## 2023-04-12 DIAGNOSIS — M25541 Pain in joints of right hand: Secondary | ICD-10-CM

## 2023-04-12 DIAGNOSIS — E1122 Type 2 diabetes mellitus with diabetic chronic kidney disease: Secondary | ICD-10-CM

## 2023-04-12 DIAGNOSIS — E119 Type 2 diabetes mellitus without complications: Secondary | ICD-10-CM

## 2023-04-12 MED ORDER — OZEMPIC (1 MG/DOSE) 4 MG/3ML ~~LOC~~ SOPN
PEN_INJECTOR | SUBCUTANEOUS | 3 refills | Status: DC
Start: 2023-04-12 — End: 2023-04-14

## 2023-04-14 MED ORDER — SEMAGLUTIDE (2 MG/DOSE) 8 MG/3ML ~~LOC~~ SOPN
2.0000 mg | PEN_INJECTOR | SUBCUTANEOUS | 3 refills | Status: DC
Start: 2023-04-14 — End: 2023-05-19

## 2023-04-18 ENCOUNTER — Other Ambulatory Visit: Payer: Self-pay | Admitting: Family Medicine

## 2023-05-19 ENCOUNTER — Ambulatory Visit: Payer: 59 | Admitting: Family Medicine

## 2023-05-19 ENCOUNTER — Encounter: Payer: Self-pay | Admitting: Family Medicine

## 2023-05-19 VITALS — BP 108/73 | HR 65 | Temp 98.6°F | Ht 61.0 in | Wt 221.0 lb

## 2023-05-19 DIAGNOSIS — Z713 Dietary counseling and surveillance: Secondary | ICD-10-CM

## 2023-05-19 DIAGNOSIS — N182 Chronic kidney disease, stage 2 (mild): Secondary | ICD-10-CM

## 2023-05-19 DIAGNOSIS — R748 Abnormal levels of other serum enzymes: Secondary | ICD-10-CM | POA: Diagnosis not present

## 2023-05-19 DIAGNOSIS — Z83719 Family history of colon polyps, unspecified: Secondary | ICD-10-CM | POA: Diagnosis not present

## 2023-05-19 DIAGNOSIS — E1169 Type 2 diabetes mellitus with other specified complication: Secondary | ICD-10-CM

## 2023-05-19 DIAGNOSIS — E1122 Type 2 diabetes mellitus with diabetic chronic kidney disease: Secondary | ICD-10-CM | POA: Diagnosis not present

## 2023-05-19 LAB — POCT GLYCOSYLATED HEMOGLOBIN (HGB A1C): Hemoglobin A1C: 5.9 % — AB (ref 4.0–5.6)

## 2023-05-19 MED ORDER — SEMAGLUTIDE (2 MG/DOSE) 8 MG/3ML ~~LOC~~ SOPN: 2.0000 mg | PEN_INJECTOR | SUBCUTANEOUS | 1 refills | Status: DC

## 2023-05-19 MED ORDER — LANCETS MISC. MISC: 1.0000 | Freq: Every day | 3 refills | Status: AC

## 2023-05-19 MED ORDER — BLOOD GLUCOSE TEST VI STRP: 1.0000 | ORAL_STRIP | Freq: Every day | 3 refills | Status: DC

## 2023-05-19 MED ORDER — LANCET DEVICE MISC: 1.0000 | Freq: Every day | 0 refills | Status: AC

## 2023-05-19 MED ORDER — BLOOD GLUCOSE MONITORING SUPPL DEVI: 1.0000 | Freq: Every day | 0 refills | Status: AC

## 2023-05-19 NOTE — Progress Notes (Signed)
 Established patient visit   Patient: Laura Gilmore   DOB: December 04, 1965   58 y.o. Female  MRN: [id number] Visit Date: 05/19/2023  Today's healthcare provider: LAURAINE LOISE BUOY, DO   Chief Complaint  Patient presents with   Diabetes    Patient does not check her glucose as she does not have a glucometer.  She is feeling fine and not having any side effects to her medications.    Weight managment    Patient was increased to the 2 mg Ozempic  however, the dose that was sent to the pharmacy was still 1 mg.  She has not picked it up yet and wanted to find out if she is supposed to increase or not.    Subjective    HPI Last annual exam: 01/17/2023   The patient, with known diabetes, presented with concerns about her medication, Ozempic . She reported confusion about the dosage as she had both one and two milligram doses available at the pharmacy. The patient has been doing well on the one milligram dose and has not missed any injections. She reported a significant weight loss of 26 pounds since March, which she attributed to her medication and regular exercise, including workouts with a personal trainer and walking.  The patient also reported occasional diarrhea, particularly after consuming foods she knew she should avoid. She has not experienced any other gastrointestinal issues such as nausea, vomiting, or constipation.  The patient has a family history of diabetes and polyps, and she underwent a colonoscopy at the age of 59 due to this history. She expressed concerns about the need for another colonoscopy, as recommended by her previous physician, due to insurance coverage issues.  The patient also reported a history of prediabetes, which eventually progressed to diabetes. She was previously on metformin  for a significant period. She has been managing her diabetes with Ozempic  and lifestyle modifications, including a regular exercise regimen and dietary changes.  The patient has not  experienced any symptoms of low blood sugar such as dizziness, lightheadedness, or vision changes. She has not been monitoring her blood glucose levels at home as she does not own a glucometer.  The patient also expressed concerns about potential vitamin D  deficiency, as she has not been tested for this in the past. She reported no history of fractures or changes in her hair, skin, or nails. She has been stable on her current thyroid medication, with no reported symptoms of palpitations.     Medications: Outpatient Medications Prior to Visit  Medication Sig   aspirin  EC 81 MG tablet Take 81 mg by mouth daily.    levonorgestrel (LILETTA) 19.5 MCG/DAY IUD IUD 1 Intra Uterine Device by Intrauterine route once.   levothyroxine  (SYNTHROID ) 50 MCG tablet TAKE 1 TAB ONCE DAILY ON EMPTY STOMACH WITH A GLASS OF WATER AT LEAST 30-60 MINUTES BEFORE BREAKFAST   meloxicam  (MOBIC ) 15 MG tablet TAKE 1 TABLET (15 MG TOTAL) BY MOUTH DAILY.   metFORMIN  (GLUCOPHAGE -XR) 500 MG 24 hr tablet TAKE 1 TABLET BY MOUTH EVERY DAY WITH DINNER   rosuvastatin  (CRESTOR ) 40 MG tablet Take 1 tablet (40 mg total) by mouth daily.   sucralfate  (CARAFATE ) 1 g tablet Take 1 tablet (1 g total) by mouth 4 (four) times daily -  with meals and at bedtime. Take as needed for acid reflux   traZODone (DESYREL) 50 MG tablet    [DISCONTINUED] Semaglutide , 2 MG/DOSE, 8 MG/3ML SOPN Inject 2 mg as directed once a week.  No facility-administered medications prior to visit.        Objective    BP 108/73 (BP Location: Right Arm, Patient Position: Sitting, Cuff Size: Large)   Pulse 65   Temp 98.6 F (37 C) (Oral)   Ht 5' 1 (1.549 m)   Wt 221 lb (100.2 kg)   SpO2 100%   BMI 41.76 kg/m     Physical Exam Vitals and nursing note reviewed.  Constitutional:      General: She is not in acute distress.    Appearance: Normal appearance.  HENT:     Head: Normocephalic and atraumatic.  Eyes:     General: No scleral icterus.     Conjunctiva/sclera: Conjunctivae normal.  Cardiovascular:     Rate and Rhythm: Normal rate.  Pulmonary:     Effort: Pulmonary effort is normal.  Neurological:     Mental Status: She is alert and oriented to person, place, and time. Mental status is at baseline.  Psychiatric:        Mood and Affect: Mood normal.        Behavior: Behavior normal.      Results for orders placed or performed in visit on 05/19/23  Comprehensive metabolic panel  Result Value Ref Range   Glucose 93 70 - 99 mg/dL   BUN 10 6 - 24 mg/dL   Creatinine, Ser 9.04 0.57 - 1.00 mg/dL   eGFR 71 >40 fO/fpw/8.26   BUN/Creatinine Ratio 11 9 - 23   Sodium 142 134 - 144 mmol/L   Potassium 4.0 3.5 - 5.2 mmol/L   Chloride 102 96 - 106 mmol/L   CO2 24 20 - 29 mmol/L   Calcium  9.7 8.7 - 10.2 mg/dL   Total Protein 7.3 6.0 - 8.5 g/dL   Albumin 4.4 3.8 - 4.9 g/dL   Globulin, Total 2.9 1.5 - 4.5 g/dL   Bilirubin Total 0.5 0.0 - 1.2 mg/dL   Alkaline Phosphatase 177 (H) 44 - 121 IU/L   AST 16 0 - 40 IU/L   ALT 13 0 - 32 IU/L  Vitamin D  1,25 dihydroxy  Result Value Ref Range   Vitamin D  1, 25 (OH)2 Total WILL FOLLOW    Vitamin D2 1, 25 (OH)2 WILL FOLLOW    Vitamin D3 1, 25 (OH)2 WILL FOLLOW   Vitamin D  25 hydroxy  Result Value Ref Range   Vit D, 25-Hydroxy 23.3 (L) 30.0 - 100.0 ng/mL  Microalbumin / creatinine urine ratio  Result Value Ref Range   Creatinine, Urine 99.3 Not Estab. mg/dL   Microalbumin, Urine 43.1 Not Estab. ug/mL   Microalb/Creat Ratio 57 (H) 0 - 29 mg/g creat  Parathyroid  hormone, intact (no Ca)  Result Value Ref Range   PTH 33 15 - 65 pg/mL  PTH-Related Peptide  Result Value Ref Range   PTH-related peptide <2.0 pmol/L  HM PAP SMEAR  Result Value Ref Range   HM Pap smear Normal   POCT glycosylated hemoglobin (Hb A1C)  Result Value Ref Range   Hemoglobin A1C 5.9 (A) 4.0 - 5.6 %   HbA1c POC (<> result, manual entry)     HbA1c, POC (prediabetic range)     HbA1c, POC (controlled diabetic  range)    Results Console HPV  Result Value Ref Range   CHL HPV Negative     Assessment & Plan    Type 2 diabetes mellitus with stage 2 chronic kidney disease, without long-term current use of insulin (HCC) Assessment & Plan: Type 2 Diabetes  Mellitus, managed with Ozempic  2 mg. Last A1c was 5.9, indicating good control. Discussed glucometer use for monitoring blood glucose, especially for hypoglycemia symptoms. Occasional diarrhea likely due to dietary indiscretions. Emphasized continued weight loss and exercise. Potential need for lifelong medication if pancreatic function declines. - Resend Ozempic  2 mg [facility] - Send prescription for glucometer - Refer to nutritionist for diabetes management - Order repeat A1c - Provide instructions on glucometer use  Orders: -     POCT glycosylated hemoglobin (Hb A1C) -     Amb Referral to Nutrition and Diabetic Education -     Blood Glucose Monitoring Suppl; 1 each by Does not apply route daily before breakfast. May substitute to any manufacturer covered by patient's insurance.  Dispense: 1 each; Refill: 0 -     Blood Glucose Test; 1 each by In Vitro route daily before breakfast. May substitute to any manufacturer covered by patient's insurance.  Dispense: 100 strip; Refill: 3 -     Lancet Device; 1 each by Does not apply route daily before breakfast. May substitute to any manufacturer covered by patient's insurance.  Dispense: 1 each; Refill: 0 -     Lancets Misc.; 1 each by Does not apply route daily before breakfast. May substitute to any manufacturer covered by patient's insurance.  Dispense: 100 each; Refill: 3 -     Microalbumin / creatinine urine ratio -     Parathyroid  hormone, intact (no Ca) -     PTH-related peptide -     Semaglutide  (2 MG/DOSE); Inject 2 mg as directed once a week.  Dispense: 9 mL; Refill: 1  Family history of colon polyps, unspecified Assessment & Plan: Family history of colonic polyps and diverticulitis.  Last colonoscopy at age 54 was normal. Discussed need for follow-up colonoscopy and insurance coverage. Explained Cologuard as an alternative but less accurate. - Advise contacting insurance regarding coverage for colonoscopy and Cologuard - Consider ordering Cologuard if insurance does not cover colonoscopy   Elevated alkaline phosphatase level Assessment & Plan: No known history of vitamin D  deficiency. Elevated alkaline phosphatase noted previously, possibly indicating vitamin D  deficiency. Discussed over-the-counter vitamin D  supplementation if levels are low but not deficient. Consider prescription vitamin D  if levels are deficient. - Order comprehensive vitamin D  panel (D1, D2, D3) - Discuss potential for over-the-counter vitamin D  supplementation if levels are low but not deficient - Consider prescription vitamin D  if levels are deficient  Orders: -     Comprehensive metabolic panel -     Vitamin D  1,25 dihydroxy -     VITAMIN D  25 Hydroxy (Vit-D Deficiency, Fractures)  Weight loss counseling, encounter for Assessment & Plan: Lost 26 pounds since March, currently 221 pounds. Appetite well-controlled on Ozempic , with occasional dietary indiscretions causing diarrhea. Discussed continued weight loss and exercise.  - Encourage continued exercise regimen - Discuss potential for increasing Ozempic  dose for weight loss if needed    General Health Maintenance Up to date on Pap smear, next due in April 2028. Discussed eligibility for pneumonia vaccine due to diabetes. Prefers to wait until age 46 for the vaccine. - Offered pneumonia vaccine, patient prefers to wait until age 6   Return in about 3 months (around 08/17/2023) for DM.      I discussed the assessment and treatment plan with the patient  The patient was provided an opportunity to ask questions and all were answered. The patient agreed with the plan and demonstrated an understanding of the instructions.  The patient was  advised to call back or seek an in-person evaluation if the symptoms worsen or if the condition fails to improve as anticipated.    LAURAINE LOISE BUOY, DO  [facility] [phone] (phone) [phone] (fax)  [facility]

## 2023-05-19 NOTE — Patient Instructions (Signed)
 Check with insurance "My GI recommended repeat colonoscopy in 5 years because my dad had polyps, will insurance cover a colonoscopy and/or cologuard at this point or would I need to wait a full ten years since my result was normal?"

## 2023-05-27 ENCOUNTER — Encounter: Payer: Self-pay | Admitting: Family Medicine

## 2023-05-28 DIAGNOSIS — Z713 Dietary counseling and surveillance: Secondary | ICD-10-CM | POA: Insufficient documentation

## 2023-05-28 DIAGNOSIS — Z83719 Family history of colon polyps, unspecified: Secondary | ICD-10-CM | POA: Insufficient documentation

## 2023-05-28 DIAGNOSIS — R748 Abnormal levels of other serum enzymes: Secondary | ICD-10-CM | POA: Insufficient documentation

## 2023-05-28 NOTE — Assessment & Plan Note (Signed)
No known history of vitamin D deficiency. Elevated alkaline phosphatase noted previously, possibly indicating vitamin D deficiency. Discussed over-the-counter vitamin D supplementation if levels are low but not deficient. Consider prescription vitamin D if levels are deficient. - Order comprehensive vitamin D panel (D1, D2, D3) - Discuss potential for over-the-counter vitamin D supplementation if levels are low but not deficient - Consider prescription vitamin D if levels are deficient

## 2023-05-28 NOTE — Assessment & Plan Note (Signed)
Family history of colonic polyps and diverticulitis. Last colonoscopy at age 58 was normal. Discussed need for follow-up colonoscopy and insurance coverage. Explained Cologuard as an alternative but less accurate. - Advise contacting insurance regarding coverage for colonoscopy and Cologuard - Consider ordering Cologuard if insurance does not cover colonoscopy

## 2023-05-28 NOTE — Assessment & Plan Note (Signed)
Type 2 Diabetes Mellitus, managed with Ozempic 2 mg. Last A1c was 5.9, indicating good control. Discussed glucometer use for monitoring blood glucose, especially for hypoglycemia symptoms. Occasional diarrhea likely due to dietary indiscretions. Emphasized continued weight loss and exercise. Potential need for lifelong medication if pancreatic function declines. - [facility] - Send prescription for glucometer - Refer to nutritionist for diabetes management - Order repeat A1c - Provide instructions on glucometer use

## 2023-05-28 NOTE — Assessment & Plan Note (Addendum)
Lost 26 pounds since March, currently 221 pounds. Appetite well-controlled on Ozempic, with occasional dietary indiscretions causing diarrhea. Discussed continued weight loss and exercise.  - Encourage continued exercise regimen - Discuss potential for increasing Ozempic dose for weight loss if needed

## 2023-06-02 ENCOUNTER — Telehealth: Payer: 59 | Admitting: Physician Assistant

## 2023-06-02 DIAGNOSIS — R6889 Other general symptoms and signs: Secondary | ICD-10-CM | POA: Diagnosis not present

## 2023-06-02 LAB — COMPREHENSIVE METABOLIC PANEL
ALT: 13 [IU]/L (ref 0–32)
AST: 16 [IU]/L (ref 0–40)
Albumin: 4.4 g/dL (ref 3.8–4.9)
Alkaline Phosphatase: 177 [IU]/L — ABNORMAL HIGH (ref 44–121)
BUN/Creatinine Ratio: 11 (ref 9–23)
BUN: 10 mg/dL (ref 6–24)
Bilirubin Total: 0.5 mg/dL (ref 0.0–1.2)
CO2: 24 mmol/L (ref 20–29)
Calcium: 9.7 mg/dL (ref 8.7–10.2)
Chloride: 102 mmol/L (ref 96–106)
Creatinine, Ser: 0.95 mg/dL (ref 0.57–1.00)
Globulin, Total: 2.9 g/dL (ref 1.5–4.5)
Glucose: 93 mg/dL (ref 70–99)
Potassium: 4 mmol/L (ref 3.5–5.2)
Sodium: 142 mmol/L (ref 134–144)
Total Protein: 7.3 g/dL (ref 6.0–8.5)
eGFR: 71 mL/min/{1.73_m2} (ref >59)

## 2023-06-02 LAB — MICROALBUMIN / CREATININE URINE RATIO
Creatinine, Urine: 99.3 mg/dL
Microalb/Creat Ratio: 57 mg/g{creat} — ABNORMAL HIGH (ref 0–29)
Microalbumin, Urine: 56.8 ug/mL

## 2023-06-02 LAB — PARATHYROID HORMONE, INTACT (NO CA): PTH: 33 pg/mL (ref 15–65)

## 2023-06-02 LAB — PTH-RELATED PEPTIDE: PTH-related peptide: <2 pmol/L

## 2023-06-02 LAB — VITAMIN D 1,25 DIHYDROXY
Vitamin D 1, 25 (OH)2 Total: 56 pg/mL
Vitamin D3 1, 25 (OH)2: 56 pg/mL

## 2023-06-02 LAB — VITAMIN D 25 HYDROXY (VIT D DEFICIENCY, FRACTURES): Vit D, 25-Hydroxy: 23.3 ng/mL — ABNORMAL LOW (ref 30.0–100.0)

## 2023-06-02 MED ORDER — OSELTAMIVIR PHOSPHATE 75 MG PO CAPS: 75.0000 mg | ORAL_CAPSULE | Freq: Two times a day (BID) | ORAL | 0 refills | Status: DC

## 2023-06-02 MED ORDER — BENZONATATE 100 MG PO CAPS: 100.0000 mg | ORAL_CAPSULE | Freq: Three times a day (TID) | ORAL | 0 refills | Status: DC | PRN

## 2023-06-02 NOTE — Patient Instructions (Signed)
Laura Gilmore, thank you for joining Piedad Climes, PA-C for today's virtual visit.  While this provider is not your primary care provider (PCP), if your PCP is located in our provider database this encounter information will be shared with them immediately following your visit.   [facility] [place] MyChart account gives you access to today's visit and all your visits, tests, and labs performed at [facility] " click here if you don't have a [facility] MyChart account or go to [email or web address]  Consent: (Patient) Laura Gilmore provided verbal consent for this virtual visit at the beginning of the encounter.  Current Medications:  Current Outpatient Medications:    benzonatate (TESSALON) 100 MG capsule, Take 1 capsule (100 mg total) by mouth 3 (three) times daily as needed for cough., Disp: 30 capsule, Rfl: 0   oseltamivir (TAMIFLU) 75 MG capsule, Take 1 capsule (75 mg total) by mouth 2 (two) times daily., Disp: 10 capsule, Rfl: 0   aspirin EC 81 MG tablet, Take 81 mg by mouth daily. , Disp: , Rfl:    Blood Glucose Monitoring Suppl DEVI, 1 each by Does not apply route daily before breakfast. May substitute to any manufacturer covered by patient's insurance., Disp: 1 each, Rfl: 0   Glucose Blood (BLOOD GLUCOSE TEST STRIPS) STRP, 1 each by In Vitro route daily before breakfast. May substitute to any manufacturer covered by patient's insurance., Disp: 100 strip, Rfl: 3   Lancet Device MISC, 1 each by Does not apply route daily before breakfast. May substitute to any manufacturer covered by patient's insurance., Disp: 1 each, Rfl: 0   Lancets Misc. MISC, 1 each by Does not apply route daily before breakfast. May substitute to any manufacturer covered by patient's insurance., Disp: 100 each, Rfl: 3   levonorgestrel (LILETTA) 19.5 MCG/DAY IUD IUD, 1 Intra Uterine Device by Intrauterine route once., Disp: , Rfl:    levothyroxine (SYNTHROID) 50 MCG tablet, TAKE 1 TAB  ONCE DAILY ON EMPTY STOMACH WITH A GLASS OF WATER AT LEAST 30-60 MINUTES BEFORE BREAKFAST, Disp: 30 tablet, Rfl: 1   meloxicam (MOBIC) 15 MG tablet, TAKE 1 TABLET (15 MG TOTAL) BY MOUTH DAILY., Disp: 30 tablet, Rfl: 1   metFORMIN (GLUCOPHAGE-XR) 500 MG 24 hr tablet, TAKE 1 TABLET BY MOUTH EVERY DAY WITH DINNER, Disp: 30 tablet, Rfl: 1   rosuvastatin (CRESTOR) 40 MG tablet, Take 1 tablet (40 mg total) by mouth daily., Disp: 90 tablet, Rfl: 3   Semaglutide, 2 MG/DOSE, 8 MG/3ML SOPN, Inject 2 mg as directed once a week., Disp: 9 mL, Rfl: 1   sucralfate (CARAFATE) 1 g tablet, Take 1 tablet (1 g total) by mouth 4 (four) times daily -  with meals and at bedtime. Take as needed for acid reflux, Disp: 30 tablet, Rfl: 0   traZODone (DESYREL) 50 MG tablet, , Disp: , Rfl:    Medications ordered in this encounter:  Meds ordered this encounter  Medications   benzonatate (TESSALON) 100 MG capsule    Sig: Take 1 capsule (100 mg total) by mouth 3 (three) times daily as needed for cough.    Dispense:  30 capsule    Refill:  0    Supervising Provider:   Merrilee Jansky [id number]   oseltamivir (TAMIFLU) 75 MG capsule    Sig: Take 1 capsule (75 mg total) by mouth 2 (two) times daily.    Dispense:  10 capsule    Refill:  0    Supervising Provider:  Merrilee Jansky [[id number]]     *If you need refills on other medications prior to your next appointment, please contact your pharmacy*  Follow-Up: Call back or seek an in-person evaluation if the symptoms worsen or if the condition fails to improve as anticipated.  [facility] [phone]  Other Instructions Please keep well-hydrated and try to get plenty of rest. If you have a humidifier, place it in the bedroom and run it at night. Start a saline nasal rinse for nasal congestion. You can consider use of a nasal steroid spray like Flonase or Nasacort OTC. You can alternate between Tylenol and Ibuprofen if needed for fever, body aches,  headache and/or throat pain. Salt water-gargles and chloraseptic spray can be very beneficial for sore throat. Mucinex-DM for congestion or cough. Please take all prescribed medications as directed.  Remain out of work until [employer] for 24 hours without a fever-reducing medication, and you are feeling better.  You should mask until symptoms are resolved.  If anything worsens despite treatment, you need to be evaluated in-person. Please do not delay care.  Influenza, Adult Influenza is also called "the flu." It is an infection in the lungs, nose, and throat (respiratory tract). It spreads easily from person to person (is contagious). The flu causes symptoms that are like a cold, along with high fever and body aches. What are the causes? This condition is caused by the influenza virus. You can get the virus by: Breathing in droplets that are in the air after a person infected with the flu coughed or sneezed. Touching something that has the virus on it and then touching your mouth, nose, or eyes. What increases the risk? Certain things may make you more likely to get the flu. These include: Not washing your hands often. Having close contact with many people during cold and flu season. Touching your mouth, eyes, or nose without first washing your hands. Not getting a flu shot every year. You may have a higher risk for the flu, and serious problems, such as a lung infection (pneumonia), if you: Are older than 65. Are pregnant. Have a weakened disease-fighting system (immune system) because of a disease or because you are taking certain medicines. Have a long-term (chronic) condition, such as: Heart, kidney, or lung disease. Diabetes. Asthma. Have a liver disorder. Are very overweight (morbidly obese). Have anemia. What are the signs or symptoms? Symptoms usually begin suddenly and last 4-14 days. They may include: Fever and chills. Headaches, body aches, or muscle aches. Sore  throat. Cough. Runny or stuffy (congested) nose. Feeling discomfort in your chest. Not wanting to eat as much as normal. Feeling weak or tired. Feeling dizzy. Feeling sick to your stomach or throwing up. How is this treated? If the flu is found early, you can be treated with antiviral medicine. This can help to reduce how bad the illness is and how long it lasts. This may be given by mouth or through an IV tube. Taking care of yourself at home can help your symptoms get better. Your doctor may want you to: Take over-the-counter medicines. Drink plenty of fluids. The flu often goes away on its own. If you have very bad symptoms or other problems, you may be treated in a hospital. Follow these instructions at home:     Activity Rest as needed. Get plenty of sleep. Stay home from work or school as told by your doctor. Do not leave home until you do not have a fever  for 24 hours without taking medicine. Leave home only to go to your doctor. Eating and drinking Take an ORS (oral rehydration solution). This is a drink that is sold at pharmacies and stores. Drink enough fluid to keep your pee pale yellow. Drink clear fluids in small amounts as you are able. Clear fluids include: Water. Ice chips. Fruit juice mixed with water. Low-calorie sports drinks. Eat bland foods that are easy to digest. Eat small amounts as you are able. These foods include: Bananas. Applesauce. Rice. Lean meats. Toast. Crackers. Do not eat or drink: Fluids that have a lot of sugar or caffeine. Alcohol. Spicy or fatty foods. General instructions Take over-the-counter and prescription medicines only as told by your doctor. Use a cool mist humidifier to add moisture to the air in your home. This can make it easier for you to breathe. When using a cool mist humidifier, clean it daily. Empty water and replace with clean water. Cover your mouth and nose when you cough or sneeze. Wash your hands with soap and  water often and for at least 20 seconds. This is also important after you cough or sneeze. If you cannot use soap and water, use alcohol-based hand sanitizer. Keep all follow-up visits. How is this prevented?  Get a flu shot every year. You may get the flu shot in late summer, fall, or winter. Ask your doctor when you should get your flu shot. Avoid contact with people who are sick during fall and winter. This is cold and flu season. Contact a doctor if: You get new symptoms. You have: Chest pain. Watery poop (diarrhea). A fever. Your cough gets worse. You start to have more mucus. You feel sick to your stomach. You throw up. Get help right away if you: Have shortness of breath. Have trouble breathing. Have skin or nails that turn a bluish color. Have very bad pain or stiffness in your neck. Get a sudden headache. Get sudden pain in your face or ear. Cannot eat or drink without throwing up. These symptoms may represent a serious problem that is an emergency. Get medical help right away. Call your local emergency services (911 in the [place] Do not wait to see if the symptoms will go away. Do not drive yourself to the hospital. Summary Influenza is also called "the flu." It is an infection in the lungs, nose, and throat. It spreads easily from person to person. Take over-the-counter and prescription medicines only as told by your doctor. Getting a flu shot every year is the best way to not get the flu. This information is not intended to replace advice given to you by your health care provider. Make sure you discuss any questions you have with your health care provider. Document Revised: 12/14/2019 Document Reviewed: 12/14/2019 Elsevier Patient Education  2023 Elsevier Inc[employer]      If you have been instructed to have an in-person evaluation today at a local Urgent Care facility, please use the link below. It will take you to a list of all of our available [facility],  including address, phone number and hours of operation. Please do not delay care.  [facility]  If you or a family member do not have a primary care provider, use the link below to schedule a visit and establish care. When you choose a [facility] primary care physician or advanced practice provider, you gain a long-term partner in health. Find a Primary Care Provider  Learn more about [facility]'s in-office and virtual care options: [place] - Get Care Now

## 2023-06-02 NOTE — Progress Notes (Signed)
Virtual Visit Consent   Laura Gilmore, you are scheduled for a virtual visit with a [facility] provider today. Just as with appointments in the office, your consent must be obtained to participate. Your consent will be active for this visit and any virtual visit you may have with one of our providers in the next 365 days. If you have a MyChart account, a copy of this consent can be sent to you electronically.  As this is a virtual visit, video technology does not allow for your provider to perform a traditional examination. This may limit your provider's ability to fully assess your condition. If your provider identifies any concerns that need to be evaluated in person or the need to arrange testing (such as labs, EKG, etc.), we will make arrangements to do so. Although advances in technology are sophisticated, we cannot ensure that it will always work on either your end or our end. If the connection with a video visit is poor, the visit may have to be switched to a telephone visit. With either a video or telephone visit, we are not always able to ensure that we have a secure connection.  By engaging in this virtual visit, you consent to the provision of healthcare and authorize for your insurance to be billed (if applicable) for the services provided during this visit. Depending on your insurance coverage, you may receive a charge related to this service.  I need to obtain your verbal consent now. Are you willing to proceed with your visit today? Laura Gilmore has provided verbal consent on 06/02/2023 for a virtual visit (video or telephone). Piedad Climes, [place]  Date: 06/02/2023 11:11 AM  Virtual Visit via Video Note   I, Piedad Climes, connected with  Laura Gilmore  ([id number], 11/30/1965) on 06/02/23 at 11:00 AM EST by a video-enabled telemedicine application and verified that I am speaking with the correct person using two identifiers.  Location: Patient: Virtual  Visit Location Patient: Home Provider: Virtual Visit Location Provider: Home Office   I discussed the limitations of evaluation and management by telemedicine and the availability of in person appointments. The patient expressed understanding and agreed to proceed.    History of Present Illness: Laura Gilmore is a 58 y.o. who identifies as a female who was assigned female at birth, and is being seen today for concern of influenza. Endorses symptoms starting 2 days ago with rhinorrhea, cough, sore throat, headache, fever, chills, aches. Has been taking Nyquil and Mucinex-D OTC for her symptoms.  HPI: HPI  Problems:  Patient Active Problem List   Diagnosis Date Noted   Weight loss counseling, encounter for 05/28/2023   Family history of colon polyps, unspecified 05/28/2023   Elevated alkaline phosphatase level 05/28/2023   Annual physical exam 01/24/2023   Hypothyroidism 07/13/2022   Arthralgia of both hands 07/13/2022   Type 2 diabetes mellitus with stage 2 chronic kidney disease, without long-term current use of insulin (HCC) 01/05/2018   Hyperlipidemia associated with type 2 diabetes mellitus (HCC) 04/09/2015    Allergies: No Known Allergies Medications:  Current Outpatient Medications:    benzonatate (TESSALON) 100 MG capsule, Take 1 capsule (100 mg total) by mouth 3 (three) times daily as needed for cough., Disp: 30 capsule, Rfl: 0   oseltamivir (TAMIFLU) 75 MG capsule, Take 1 capsule (75 mg total) by mouth 2 (two) times daily., Disp: 10 capsule, Rfl: 0   aspirin EC 81 MG tablet, Take 81 mg by mouth daily. ,  Disp: , Rfl:    Blood Glucose Monitoring Suppl DEVI, 1 each by Does not apply route daily before breakfast. May substitute to any manufacturer covered by patient's insurance., Disp: 1 each, Rfl: 0   Glucose Blood (BLOOD GLUCOSE TEST STRIPS) STRP, 1 each by In Vitro route daily before breakfast. May substitute to any manufacturer covered by patient's insurance., Disp: 100  strip, Rfl: 3   Lancet Device MISC, 1 each by Does not apply route daily before breakfast. May substitute to any manufacturer covered by patient's insurance., Disp: 1 each, Rfl: 0   Lancets Misc. MISC, 1 each by Does not apply route daily before breakfast. May substitute to any manufacturer covered by patient's insurance., Disp: 100 each, Rfl: 3   levonorgestrel (LILETTA) 19.5 MCG/DAY IUD IUD, 1 Intra Uterine Device by Intrauterine route once., Disp: , Rfl:    levothyroxine (SYNTHROID) 50 MCG tablet, TAKE 1 TAB ONCE DAILY ON EMPTY STOMACH WITH A GLASS OF WATER AT LEAST 30-60 MINUTES BEFORE BREAKFAST, Disp: 30 tablet, Rfl: 1   meloxicam (MOBIC) 15 MG tablet, TAKE 1 TABLET (15 MG TOTAL) BY MOUTH DAILY., Disp: 30 tablet, Rfl: 1   metFORMIN (GLUCOPHAGE-XR) 500 MG 24 hr tablet, TAKE 1 TABLET BY MOUTH EVERY DAY WITH DINNER, Disp: 30 tablet, Rfl: 1   rosuvastatin (CRESTOR) 40 MG tablet, Take 1 tablet (40 mg total) by mouth daily., Disp: 90 tablet, Rfl: 3   Semaglutide, 2 MG/DOSE, 8 MG/3ML SOPN, Inject 2 mg as directed once a week., Disp: 9 mL, Rfl: 1   sucralfate (CARAFATE) 1 g tablet, Take 1 tablet (1 g total) by mouth 4 (four) times daily -  with meals and at bedtime. Take as needed for acid reflux, Disp: 30 tablet, Rfl: 0   traZODone (DESYREL) 50 MG tablet, , Disp: , Rfl:   Observations/Objective: Patient is well-developed, well-nourished in no acute distress.  Resting comfortably at home.  Head is normocephalic, atraumatic.  No labored breathing. Speech is clear and coherent with logical content.  Patient is alert and oriented at baseline.   Assessment and Plan: 1. Flu-like symptoms (Primary) - benzonatate (TESSALON) 100 MG capsule; Take 1 capsule (100 mg total) by mouth 3 (three) times daily as needed for cough.  Dispense: 30 capsule; Refill: 0 - oseltamivir (TAMIFLU) 75 MG capsule; Take 1 capsule (75 mg total) by mouth 2 (two) times daily.  Dispense: 10 capsule; Refill: 0  Negative COVID.  Classic influenza symptoms. Unsure of exposure. Supportive measures, OTC medications and Vitamin recommendations reviewed. Will start Tamiflu per orders. Tessalon per orders. Quarantine reviewed with patient.    Follow Up Instructions: I discussed the assessment and treatment plan with the patient. The patient was provided an opportunity to ask questions and all were answered. The patient agreed with the plan and demonstrated an understanding of the instructions.  A copy of instructions were sent to the patient via MyChart unless otherwise noted below.   The patient was advised to call back or seek an in-person evaluation if the symptoms worsen or if the condition fails to improve as anticipated.    Piedad Climes, PA-C

## 2023-06-02 NOTE — Progress Notes (Deleted)
Virtual Visit Consent   [name], you are scheduled for a virtual visit with a [facility] provider today. Just as with appointments in the office, your consent must be obtained to participate. Your consent will be active for this visit and any virtual visit you may have with one of our providers in the next 365 days. If you have a MyChart account, a copy of this consent can be sent to you electronically.  As this is a virtual visit, video technology does not allow for your provider to perform a traditional examination. This may limit your provider's ability to fully assess your condition. If your provider identifies any concerns that need to be evaluated in person or the need to arrange testing (such as labs, EKG, etc.), we will make arrangements to do so. Although advances in technology are sophisticated, we cannot ensure that it will always work on either your end or our end. If the connection with a video visit is poor, the visit may have to be switched to a telephone visit. With either a video or telephone visit, we are not always able to ensure that we have a secure connection.  By engaging in this virtual visit, you consent to the provision of healthcare and authorize for your insurance to be billed (if applicable) for the services provided during this visit. Depending on your insurance coverage, you may receive a charge related to this service.  I need to obtain your verbal consent now. Are you willing to proceed with your visit today? ZELDY AHLERT has provided verbal consent on 06/02/2023 for a virtual visit (video or telephone). Freddy Finner, NP  Date: 06/02/2023 11:03 AM  Virtual Visit via Video Note   I, Freddy Finner, connected with  TAMELA LAFFEY  ([id number], 1965/10/06) on 06/02/23 at 11:00 AM EST by a video-enabled telemedicine application and verified that I am speaking with the correct person using two identifiers.  Location: Patient: {Virtual Visit  Location Patient:[id number]::"Home"} Provider: Virtual Visit Location Provider: Home Office   I discussed the limitations of evaluation and management by telemedicine and the availability of in person appointments. The patient expressed understanding and agreed to proceed.    History of Present Illness: Laura Gilmore is a 58 y.o. who identifies as a female who was assigned female at birth, and is being seen today for ***.  HPI: HPI  Problems:  Patient Active Problem List   Diagnosis Date Noted  . Weight loss counseling, encounter for 05/28/2023  . Family history of colon polyps, unspecified 05/28/2023  . Elevated alkaline phosphatase level 05/28/2023  . Annual physical exam 01/24/2023  . Hypothyroidism 07/13/2022  . Arthralgia of both hands 07/13/2022  . Type 2 diabetes mellitus with stage 2 chronic kidney disease, without long-term current use of insulin (HCC) 01/05/2018  . Hyperlipidemia associated with type 2 diabetes mellitus (HCC) 04/09/2015    Allergies: No Known Allergies Medications:  Current Outpatient Medications:  .  aspirin EC 81 MG tablet, Take 81 mg by mouth daily. , Disp: , Rfl:  .  Blood Glucose Monitoring Suppl DEVI, 1 each by Does not apply route daily before breakfast. May substitute to any manufacturer covered by patient's insurance., Disp: 1 each, Rfl: 0 .  Glucose Blood (BLOOD GLUCOSE TEST STRIPS) STRP, 1 each by In Vitro route daily before breakfast. May substitute to any manufacturer covered by patient's insurance., Disp: 100 strip, Rfl: 3 .  Lancet Device MISC, 1 each by Does not apply route  daily before breakfast. May substitute to any manufacturer covered by patient's insurance., Disp: 1 each, Rfl: 0 .  Lancets Misc. MISC, 1 each by Does not apply route daily before breakfast. May substitute to any manufacturer covered by patient's insurance., Disp: 100 each, Rfl: 3 .  levonorgestrel (LILETTA) 19.5 MCG/DAY IUD IUD, 1 Intra Uterine Device by Intrauterine route  once., Disp: , Rfl:  .  levothyroxine (SYNTHROID) 50 MCG tablet, TAKE 1 TAB ONCE DAILY ON EMPTY STOMACH WITH A GLASS OF WATER AT LEAST 30-60 MINUTES BEFORE BREAKFAST, Disp: 30 tablet, Rfl: 1 .  meloxicam (MOBIC) 15 MG tablet, TAKE 1 TABLET (15 MG TOTAL) BY MOUTH DAILY., Disp: 30 tablet, Rfl: 1 .  metFORMIN (GLUCOPHAGE-XR) 500 MG 24 hr tablet, TAKE 1 TABLET BY MOUTH EVERY DAY WITH DINNER, Disp: 30 tablet, Rfl: 1 .  rosuvastatin (CRESTOR) 40 MG tablet, Take 1 tablet (40 mg total) by mouth daily., Disp: 90 tablet, Rfl: 3 .  Semaglutide, 2 MG/DOSE, 8 MG/3ML SOPN, Inject 2 mg as directed once a week., Disp: 9 mL, Rfl: 1 .  sucralfate (CARAFATE) 1 g tablet, Take 1 tablet (1 g total) by mouth 4 (four) times daily -  with meals and at bedtime. Take as needed for acid reflux, Disp: 30 tablet, Rfl: 0 .  traZODone (DESYREL) 50 MG tablet, , Disp: , Rfl:   Observations/Objective: Patient is well-developed, well-nourished in no acute distress.  Resting comfortably *** at home.  Head is normocephalic, atraumatic.  No labored breathing. *** Speech is clear and coherent with logical content.  Patient is alert and oriented at baseline.  ***  Assessment and Plan:   Follow Up Instructions: I discussed the assessment and treatment plan with the patient. The patient was provided an opportunity to ask questions and all were answered. The patient agreed with the plan and demonstrated an understanding of the instructions.  A copy of instructions were sent to the patient via MyChart unless otherwise noted below.    The patient was advised to call back or seek an in-person evaluation if the symptoms worsen or if the condition fails to improve as anticipated.    Freddy Finner, NP

## 2023-06-14 ENCOUNTER — Other Ambulatory Visit: Payer: Self-pay | Admitting: Family Medicine

## 2023-06-16 ENCOUNTER — Telehealth: Payer: Self-pay | Admitting: Family Medicine

## 2023-06-16 NOTE — Telephone Encounter (Signed)
 Covermymeds is requesting priro authorization Key: [id number] Ozempic  0.25 or 0.5MG  dose 2mg /71ml pen injectors

## 2023-07-14 ENCOUNTER — Ambulatory Visit: Payer: 59 | Admitting: Dietician

## 2023-08-01 LAB — HM DIABETES EYE EXAM

## 2023-08-05 ENCOUNTER — Other Ambulatory Visit: Payer: Self-pay | Admitting: Family Medicine

## 2023-08-09 ENCOUNTER — Other Ambulatory Visit: Payer: Self-pay | Admitting: Family Medicine

## 2023-08-17 ENCOUNTER — Ambulatory Visit: Payer: Self-pay | Admitting: Family Medicine

## 2023-08-18 ENCOUNTER — Ambulatory Visit: Admitting: Family Medicine

## 2023-08-18 ENCOUNTER — Encounter: Payer: Self-pay | Admitting: Family Medicine

## 2023-08-18 VITALS — BP 114/71 | HR 59 | Ht 61.0 in | Wt 216.0 lb

## 2023-08-18 DIAGNOSIS — E559 Vitamin D deficiency, unspecified: Secondary | ICD-10-CM

## 2023-08-18 DIAGNOSIS — N182 Chronic kidney disease, stage 2 (mild): Secondary | ICD-10-CM

## 2023-08-18 DIAGNOSIS — E1122 Type 2 diabetes mellitus with diabetic chronic kidney disease: Secondary | ICD-10-CM | POA: Diagnosis not present

## 2023-08-18 DIAGNOSIS — Z7985 Long-term (current) use of injectable non-insulin antidiabetic drugs: Secondary | ICD-10-CM

## 2023-08-18 LAB — POCT GLYCOSYLATED HEMOGLOBIN (HGB A1C): Hemoglobin A1C: 6.4 % — AB (ref 4.0–5.6)

## 2023-08-18 MED ORDER — SEMAGLUTIDE (1 MG/DOSE) 4 MG/3ML ~~LOC~~ SOPN
1.0000 mg | PEN_INJECTOR | SUBCUTANEOUS | 2 refills | Status: DC
Start: 2023-08-18 — End: 2023-11-17

## 2023-08-18 NOTE — Progress Notes (Signed)
 Established patient visit   Patient: Laura Gilmore   DOB: 06-01-1965   58 y.o. Female  MRN: [id number] Visit Date: 08/18/2023  Today's healthcare provider: Shirlee Latch, MD   Chief Complaint  Patient presents with   Diabetes    Pt takes sugar 3x week ranging from 90-158, diet - avoids red meat, extra sugars, and excessive carbs, walks everyday, 2x a day on weekend No concerns   Subjective    Diabetes   HPI     Diabetes    Additional comments: Pt takes sugar 3x week ranging from 90-158, diet - avoids red meat, extra sugars, and excessive carbs, walks everyday, 2x a day on weekend No concerns      Last edited by Allayne Stack on 08/18/2023  8:19 AM.       Discussed the use of AI scribe software for clinical note transcription with the patient, who gave verbal consent to proceed.  History of Present Illness   A 58 year old patient with a history of hyperlipidemia, diabetes, and hypothyroidism presents for a diabetes follow-up. The patient reports that her A1c levels have been well-controlled, with the most recent reading at 5.9. However, the patient has been experiencing significant side effects from an increased dosage of Ozempic, which was recently increased from 1mg  to 2mg . The patient reports severe indigestion and diarrhea, to the point of being unable to tolerate the medication. The patient stopped taking the medication two weeks prior to the appointment due to the severity of the side effects.  In addition to managing her diabetes, the patient has been making lifestyle changes to improve her overall health. She has been walking daily, resulting in a decrease in shirt size from 3X to 2X. The patient also reports making dietary changes, including eating more chicken and vegetables and reducing her intake of red meat and carbohydrates. Despite these changes, the patient notes that the scale has not significantly decreased.         Medications: Outpatient  Medications Prior to Visit  Medication Sig   aspirin EC 81 MG tablet Take 81 mg by mouth daily.    Blood Glucose Monitoring Suppl DEVI 1 each by Does not apply route daily before breakfast. May substitute to any manufacturer covered by patient's insurance.   Glucose Blood (BLOOD GLUCOSE TEST STRIPS) STRP 1 each by In Vitro route daily before breakfast. May substitute to any manufacturer covered by patient's insurance.   Lancet Device MISC 1 each by Does not apply route daily before breakfast. May substitute to any manufacturer covered by patient's insurance.   levonorgestrel (LILETTA) 19.5 MCG/DAY IUD IUD 1 Intra Uterine Device by Intrauterine route once.   levothyroxine (SYNTHROID) 50 MCG tablet TAKE 1 TAB ONCE DAILY ON EMPTY STOMACH WITH A GLASS OF WATER AT LEAST 30-60 MINUTES BEFORE BREAKFAST   meloxicam (MOBIC) 15 MG tablet TAKE 1 TABLET (15 MG TOTAL) BY MOUTH DAILY.   metFORMIN (GLUCOPHAGE-XR) 500 MG 24 hr tablet TAKE 1 TABLET BY MOUTH EVERY DAY WITH DINNER   rosuvastatin (CRESTOR) 40 MG tablet Take 1 tablet (40 mg total) by mouth daily.   [DISCONTINUED] Semaglutide, 2 MG/DOSE, 8 MG/3ML SOPN Inject 2 mg as directed once a week.   [DISCONTINUED] benzonatate (TESSALON) 100 MG capsule Take 1 capsule (100 mg total) by mouth 3 (three) times daily as needed for cough. (Patient not taking: Reported on 08/18/2023)   [DISCONTINUED] oseltamivir (TAMIFLU) 75 MG capsule Take 1 capsule (75 mg total) by mouth 2 (two)  times daily. (Patient not taking: Reported on 08/18/2023)   [DISCONTINUED] sucralfate (CARAFATE) 1 g tablet Take 1 tablet (1 g total) by mouth 4 (four) times daily -  with meals and at bedtime. Take as needed for acid reflux (Patient not taking: Reported on 08/18/2023)   [DISCONTINUED] traZODone (DESYREL) 50 MG tablet  (Patient not taking: Reported on 08/18/2023)   No facility-administered medications prior to visit.    Review of Systems     Objective    BP 114/71 (BP Location: Right Arm,  Cuff Size: Normal)   Pulse (!) 59   Ht 5\' 1"  (1.549 m)   Wt 216 lb (98 kg)   SpO2 100%   BMI 40.81 kg/m    Physical Exam Vitals reviewed.  Constitutional:      General: She is not in acute distress.    Appearance: Normal appearance. She is well-developed. She is not diaphoretic.  HENT:     Head: Normocephalic and atraumatic.  Eyes:     General: No scleral icterus.    Conjunctiva/sclera: Conjunctivae normal.  Neck:     Thyroid: No thyromegaly.  Cardiovascular:     Rate and Rhythm: Normal rate and regular rhythm.     Heart sounds: Normal heart sounds. No murmur heard. Pulmonary:     Effort: Pulmonary effort is normal. No respiratory distress.     Breath sounds: Normal breath sounds. No wheezing, rhonchi or rales.  Musculoskeletal:     Cervical back: Neck supple.     Right lower leg: No edema.     Left lower leg: No edema.  Lymphadenopathy:     Cervical: No cervical adenopathy.  Skin:    General: Skin is warm and dry.     Findings: No rash.  Neurological:     Mental Status: She is alert and oriented to person, place, and time. Mental status is at baseline.  Psychiatric:        Mood and Affect: Mood normal.        Behavior: Behavior normal.      Results for orders placed or performed in visit on 08/18/23  POCT HgB A1C  Result Value Ref Range   Hemoglobin A1C 6.4 (A) 4.0 - 5.6 %   HbA1c POC (<> result, manual entry)     HbA1c, POC (prediabetic range)     HbA1c, POC (controlled diabetic range)      Assessment & Plan     Problem List Items Addressed This Visit       Endocrine   Type 2 diabetes mellitus with stage 2 chronic kidney disease, without long-term current use of insulin (HCC) - Primary   Relevant Medications   Semaglutide, 1 MG/DOSE, 4 MG/3ML SOPN   Other Relevant Orders   POCT HgB A1C (Completed)   Other Visit Diagnoses       Avitaminosis D              Type 2 Diabetes Mellitus Her last A1c was 5.9, and the current A1c is 6.4, which remains  reasonable given she discontinued Ozempic 2 mg due to severe gastrointestinal side effects, including indigestion, diarrhea, and nausea. She previously tolerated the 1 mg dose, maintaining her A1c in the high 5s to low 6s. She is implementing lifestyle changes, such as daily walking and dietary modifications, resulting in a reduction in clothing size. Discussed potential medication dose reduction if lifestyle changes further improve her A1c. She is interested in possibly discontinuing medications if lifestyle changes suffice. - Prescribe Ozempic 1  mg weekly and discontinue 2 mg dose - Follow up in 3 months to recheck A1c and assess tolerability of Ozempic 1 mg - Instruct her to report any intolerable side effects before the 12-month follow-up - Encourage continuation of lifestyle modifications, including daily walking and dietary changes  Hyperlipidemia She is on Crestor 40 mg daily. No changes in lipid management were discussed during this visit.  Hypothyroidism She is on Synthroid 50 mcg daily. No changes in thyroid management were discussed during this visit.  General Health Maintenance She recently had an eye exam at [facility] in March. Plans to obtain the records to update her chart. She is on vitamin D supplementation due to previously low levels, and this will be rechecked with her next set of labs. - Obtain eye exam records from [facility] - Continue vitamin D supplementation and recheck levels with next labs  Follow-up She will follow up in 3 months to reassess diabetes management and medication tolerability. Clarified insurance coverage with [employer] allows continued care with Dr. Payton Mccallum despite previous confusion due to a change in tax ID. - Schedule follow-up appointment with Dr. Payton Mccallum in 3 months        Return in about 3 months (around 11/17/2023) for With PCP, chronic disease f/u.       Shirlee Latch, MD  [facility] [phone] (phone) [phone] (fax)  [facility]

## 2023-08-23 ENCOUNTER — Encounter: Payer: Self-pay | Admitting: Dietician

## 2023-08-23 ENCOUNTER — Encounter: Payer: 59 | Attending: Family Medicine | Admitting: Dietician

## 2023-08-23 VITALS — Ht 61.0 in | Wt 214.3 lb

## 2023-08-23 DIAGNOSIS — Z7984 Long term (current) use of oral hypoglycemic drugs: Secondary | ICD-10-CM | POA: Diagnosis not present

## 2023-08-23 DIAGNOSIS — Z713 Dietary counseling and surveillance: Secondary | ICD-10-CM | POA: Insufficient documentation

## 2023-08-23 DIAGNOSIS — Z7985 Long-term (current) use of injectable non-insulin antidiabetic drugs: Secondary | ICD-10-CM | POA: Insufficient documentation

## 2023-08-23 DIAGNOSIS — N182 Chronic kidney disease, stage 2 (mild): Secondary | ICD-10-CM | POA: Diagnosis not present

## 2023-08-23 DIAGNOSIS — E1122 Type 2 diabetes mellitus with diabetic chronic kidney disease: Secondary | ICD-10-CM | POA: Insufficient documentation

## 2023-08-23 NOTE — Patient Instructions (Addendum)
 Work towards eating three meals a day, about 5-6 hours apart!  Begin to recognize carbohydrates, proteins, and non-starchy vegetables in your food choices!  Begin to build your meals using the proportions of the Balanced Plate. First, select your carb choice(s) for the meal. Make this 25% of your meal. Next, select your source of protein to pair with your carb choice(s). Make this another 25% of your meal. Finally, complete your meal with a variety of non-starchy vegetables. Make this the remaining 50% of your meal.  Check your blood sugar each morning before eating or drinking (fasting). Look for numbers under to gradually move towards 70 - 100 mg/dL.  When cooking meals on the weekends, prepare extra portions of protein for the week!  Work towards incorporating some resistance exercise into your routine, 2-3 times a week for ~30 minutes! Continue taking your daily walks!!

## 2023-08-23 NOTE — Progress Notes (Signed)
 Diabetes Self-Management Education  Visit Type: First/Initial  Appt. Start Time: 0945 Appt. End Time: 1110  08/23/2023  [name], identified by name and date of birth, is a 58 y.o. female with a diagnosis of Diabetes: Type 2.   ASSESSMENT Pt reports taking metformin and Ozempic for DM. Pt reports interest in dietary strategies for reducing A1c and eventually d/c Ozempic. Pt reports significant indigestion and diarrhea when increasing Ozempic to 2mg , stopped taking for 2 weeks and restarted back at 1 mg this past Sunday, no side effects since restarting lower dose.  Pt reports weight loss of ~36 lbs in the last year since starting Ozempic. Pt reports checking FBG ~3x weekly (~90 - 150 mg/dL) Pt reports following a protein focused diet with more fruits and vegetables currently, avoiding carbs. Pt reports getting busy at work and forgetting to eat until early afternoon, states they may get fast food when this happens.  Pt reports meal prepping dinner meals on the weekend, states these meals usually last them through Tuesday, states they will bring fastfood home for dinner once a week.  Pt reports walking daily since the weather has gotten better and will do YouTube exercise videos when it rains.   Height 5\' 1"  (1.549 m), weight 214 lb 4.8 oz (97.2 kg). Body mass index is 40.49 kg/m.   Diabetes Self-Management Education - 08/23/23 0956       Visit Information   Visit Type First/Initial      Initial Visit   Diabetes Type Type 2    Date Diagnosed 2019    Are you currently following a meal plan? Yes    What type of meal plan do you follow? Protein/fruit/vegetables    Are you taking your medications as prescribed? Yes      Health Coping   How would you rate your overall health? Good      Psychosocial Assessment   Patient Belief/Attitude about Diabetes Motivated to manage diabetes    What is the hardest part about your diabetes right now, causing you the most concern, or  is the most worrisome to you about your diabetes?   Making healty food and beverage choices    Self-care barriers None    Self-management support Doctor's office    Other persons present Patient    Patient Concerns Nutrition/Meal planning;Healthy Lifestyle;Medication    Special Needs None    Preferred Learning Style No preference indicated    Learning Readiness Change in progress    How often do you need to have someone help you when you read instructions, pamphlets, or other written materials from your doctor or pharmacy? 1 - Never    What is the last grade level you completed in school? Bachelor's degree      Pre-Education Assessment   Patient understands the diabetes disease and treatment process. Needs Instruction    Patient understands incorporating nutritional management into lifestyle. Needs Instruction    Patient undertands incorporating physical activity into lifestyle. Needs Instruction    Patient understands using medications safely. Needs Instruction    Patient understands monitoring blood glucose, interpreting and using results Needs Instruction    Patient understands prevention, detection, and treatment of acute complications. Needs Instruction    Patient understands prevention, detection, and treatment of chronic complications. Needs Instruction    Patient understands how to develop strategies to address psychosocial issues. Needs Instruction    Patient understands how to develop strategies to promote health/change behavior. Needs Instruction      Complications  Last HgB A1C per patient/outside source 6.4 %   08/18/2022   How often do you check your blood sugar? 3-4 times / week    Fasting Blood glucose range (mg/dL) [phone];91-478    Have you had a dilated eye exam in the past 12 months? Yes    Have you had a dental exam in the past 12 months? Yes    Are you checking your feet? No      Dietary Intake   Breakfast Special K strawberry cereal, 2% milk, coffee w/ cream     Lunch [employer] Light n' Fit, 100 calorie almond pack    Dinner 1 slice of honey wheat toast, a little butter    Beverage(s) Water, coffee, sweet tea      Activity / Exercise   Activity / Exercise Type ADL's;Light (walking / raking leaves)    How many days per week do you exercise? 7    How many minutes per day do you exercise? 30    Total minutes per week of exercise 210      Patient Education   Previous Diabetes Education No    Disease Pathophysiology Factors that contribute to the development of diabetes;Explored patient's options for treatment of their diabetes    Healthy Eating Role of diet in the treatment of diabetes and the relationship between the three main macronutrients and blood glucose level;Meal options for control of blood glucose level and chronic complications.    Being Active Role of exercise on diabetes management, blood pressure control and cardiac health.;Helped patient identify appropriate exercises in relation to his/her diabetes, diabetes complications and other health issue.   Resistance exercise   Medications Reviewed patients medication for diabetes, action, purpose, timing of dose and side effects.    Monitoring Taught/evaluated SMBG meter.   Reducing pain a/w finger stick   Chronic complications Relationship between chronic complications and blood glucose control    Diabetes Stress and Support Role of stress on diabetes;Worked with patient to identify barriers to care and solutions;Identified and addressed patients feelings and concerns about diabetes    Lifestyle and Health Coping Lifestyle issues that need to be addressed for better diabetes care      Individualized Goals (developed by patient)   Nutrition Follow meal plan discussed;Adjust meds/carbs with exercise as discussed;General guidelines for healthy choices and portions discussed    Physical Activity Exercise 3-5 times per week    Medications take my medication as prescribed    Monitoring  Test my blood  glucose as discussed    Problem Solving Eating Pattern;Social situations    Reducing Risk examine blood glucose patterns    Health Coping Ask for help with psychological, social, or emotional issues      Post-Education Assessment   Patient understands the diabetes disease and treatment process. Needs Review    Patient understands incorporating nutritional management into lifestyle. Needs Review    Patient undertands incorporating physical activity into lifestyle. Comprehends key points    Patient understands using medications safely. Comphrehends key points    Patient understands monitoring blood glucose, interpreting and using results Needs Review    Patient understands prevention, detection, and treatment of acute complications. Comprehends key points    Patient understands prevention, detection, and treatment of chronic complications. Needs Review    Patient understands how to develop strategies to address psychosocial issues. Needs Review    Patient understands how to develop strategies to promote health/change behavior. Needs Review      Outcomes  Expected Outcomes Demonstrated interest in learning. Expect positive outcomes    Future DMSE 2 months    Program Status Not Completed             Individualized Plan for Diabetes Self-Management Training:   Learning Objective:  Patient will have a greater understanding of diabetes self-management. Patient education plan is to attend individual and/or group sessions per assessed needs and concerns.   Plan:   Patient Instructions  Work towards eating three meals a day, about 5-6 hours apart!  Begin to recognize carbohydrates, proteins, and non-starchy vegetables in your food choices!  Begin to build your meals using the proportions of the Balanced Plate. First, select your carb choice(s) for the meal. Make this 25% of your meal. Next, select your source of protein to pair with your carb choice(s). Make this another 25% of your  meal. Finally, complete your meal with a variety of non-starchy vegetables. Make this the remaining 50% of your meal.  Check your blood sugar each morning before eating or drinking (fasting). Look for numbers under to gradually move towards 70 - 100 mg/dL.  When cooking meals on the weekends, prepare extra portions of protein for the week!  Work towards incorporating some resistance exercise into your routine, 2-3 times a week for ~30 minutes! Continue taking your daily walks!!   Expected Outcomes:  Demonstrated interest in learning. Expect positive outcomes  Education material provided: My Plate and Carbohydrate counting sheet, Protein Foods list, Snack Sheet  If problems or questions, patient to contact team via:  Phone and Email  Future DSME appointment: 2 months

## 2023-09-13 ENCOUNTER — Other Ambulatory Visit: Payer: Self-pay | Admitting: Obstetrics and Gynecology

## 2023-09-13 DIAGNOSIS — Z1231 Encounter for screening mammogram for malignant neoplasm of breast: Secondary | ICD-10-CM

## 2023-09-15 ENCOUNTER — Telehealth

## 2023-09-15 ENCOUNTER — Telehealth: Admitting: Family Medicine

## 2023-09-15 DIAGNOSIS — B9689 Other specified bacterial agents as the cause of diseases classified elsewhere: Secondary | ICD-10-CM | POA: Diagnosis not present

## 2023-09-15 DIAGNOSIS — J019 Acute sinusitis, unspecified: Secondary | ICD-10-CM | POA: Diagnosis not present

## 2023-09-15 MED ORDER — AMOXICILLIN-POT CLAVULANATE 875-125 MG PO TABS: 1.0000 | ORAL_TABLET | Freq: Two times a day (BID) | ORAL | 0 refills | Status: AC

## 2023-09-15 MED ORDER — PROMETHAZINE-DM 6.25-15 MG/5ML PO SYRP: 5.0000 mL | ORAL_SOLUTION | Freq: Four times a day (QID) | ORAL | 0 refills | Status: DC | PRN

## 2023-09-15 NOTE — Patient Instructions (Signed)
 Laura Gilmore, thank you for joining Laura Pion, NP for today's virtual visit.  While this provider is not your primary care provider (PCP), if your PCP is located in our provider database this encounter information will be shared with them immediately following your visit.   A [facility] MyChart account gives you access to today's visit and all your visits, tests, and labs performed at [facility] " click here if you don't have a Laura Gilmore MyChart account or go to [email or web address]  Consent: (Patient) Laura Gilmore provided verbal consent for this virtual visit at the beginning of the encounter.  Current Medications:  Current Outpatient Medications:    amoxicillin -clavulanate (AUGMENTIN) 875-125 MG tablet, Take 1 tablet by mouth 2 (two) times daily for 7 days., Disp: 14 tablet, Rfl: 0   promethazine -dextromethorphan (PROMETHAZINE -DM) 6.25-15 MG/5ML syrup, Take 5 mLs by mouth 4 (four) times daily as needed for cough., Disp: 118 mL, Rfl: 0   aspirin  EC 81 MG tablet, Take 81 mg by mouth daily. , Disp: , Rfl:    Blood Glucose Monitoring Suppl DEVI, 1 each by Does not apply route daily before breakfast. May substitute to any manufacturer covered by patient's insurance., Disp: 1 each, Rfl: 0   Glucose Blood (BLOOD GLUCOSE TEST STRIPS) STRP, 1 each by In Vitro route daily before breakfast. May substitute to any manufacturer covered by patient's insurance., Disp: 100 strip, Rfl: 3   Lancet Device MISC, 1 each by Does not apply route daily before breakfast. May substitute to any manufacturer covered by patient's insurance., Disp: 1 each, Rfl: 0   Lancets (ONETOUCH DELICA PLUS LANCET33G) MISC, SMARTSIG:1 Each Topical Every Morning, Disp: , Rfl:    levonorgestrel (LILETTA) 19.5 MCG/DAY IUD IUD, 1 Intra Uterine Device by Intrauterine route once., Disp: , Rfl:    levothyroxine  (SYNTHROID ) 50 MCG tablet, TAKE 1 TAB ONCE DAILY ON EMPTY STOMACH WITH A GLASS OF WATER AT  LEAST 30-60 MINUTES BEFORE BREAKFAST, Disp: 30 tablet, Rfl: 1   meloxicam  (MOBIC ) 15 MG tablet, TAKE 1 TABLET (15 MG TOTAL) BY MOUTH DAILY., Disp: 30 tablet, Rfl: 1   metFORMIN (GLUCOPHAGE-XR) 500 MG 24 hr tablet, TAKE 1 TABLET BY MOUTH EVERY DAY WITH DINNER, Disp: 90 tablet, Rfl: 1   rosuvastatin  (CRESTOR ) 40 MG tablet, Take 1 tablet (40 mg total) by mouth daily., Disp: 90 tablet, Rfl: 3   Semaglutide , 1 MG/DOSE, 4 MG/3ML SOPN, Inject 1 mg into the skin once a week., Disp: 3 mL, Rfl: 2   Medications ordered in this encounter:  Meds ordered this encounter  Medications   amoxicillin -clavulanate (AUGMENTIN) 875-125 MG tablet    Sig: Take 1 tablet by mouth 2 (two) times daily for 7 days.    Dispense:  14 tablet    Refill:  0    Supervising Provider:   LAMPTEY, PHILIP O [[id number]]   promethazine -dextromethorphan (PROMETHAZINE -DM) 6.25-15 MG/5ML syrup    Sig: Take 5 mLs by mouth 4 (four) times daily as needed for cough.    Dispense:  118 mL    Refill:  0    Supervising Provider:   Corine Dice [[id number]]     *If you need refills on other medications prior to your next appointment, please contact your pharmacy*  Follow-Up: Call back or seek an in-person evaluation if the symptoms worsen or if the condition fails to improve as anticipated.  [facility] [phone]  Other Instructions  - Increased rest - Increasing Fluids - Saline nasal  spray if congestion or if nasal passages feel dry. - Humidifying the air.    If you have been instructed to have an in-person evaluation today at a local Urgent Care facility, please use the link below. It will take you to a list of all of our available [facility], including address, phone number and hours of operation. Please do not delay care.  [facility]  If you or a family member do not have a primary care provider, use the link below to schedule a visit and establish care. When you choose a [facility]  primary care physician or advanced practice provider, you gain a long-term partner in health. Find a Primary Care Provider  Learn more about [facility]'s in-office and virtual care options: [facility] - Get Care Now

## 2023-09-15 NOTE — Progress Notes (Signed)
 Virtual Visit Consent   Laura Gilmore, you are scheduled for a virtual visit with a [facility] provider today. Just as with appointments in the office, your consent must be obtained to participate. Your consent will be active for this visit and any virtual visit you may have with one of our providers in the next 365 days. If you have a MyChart account, a copy of this consent can be sent to you electronically.  As this is a virtual visit, video technology does not allow for your provider to perform a traditional examination. This may limit your provider's ability to fully assess your condition. If your provider identifies any concerns that need to be evaluated in person or the need to arrange testing (such as labs, EKG, etc.), we will make arrangements to do so. Although advances in technology are sophisticated, we cannot ensure that it will always work on either your end or our end. If the connection with a video visit is poor, the visit may have to be switched to a telephone visit. With either a video or telephone visit, we are not always able to ensure that we have a secure connection.  By engaging in this virtual visit, you consent to the provision of healthcare and authorize for your insurance to be billed (if applicable) for the services provided during this visit. Depending on your insurance coverage, you may receive a charge related to this service.  I need to obtain your verbal consent now. Are you willing to proceed with your visit today? Laura Gilmore has provided verbal consent on 09/15/2023 for a virtual visit (video or telephone). [name], NP  Date: 09/15/2023 11:30 AM   Virtual Visit via Video Note   I, [name], connected with  Laura Gilmore  ([id number], 30-Apr-1966) on 09/15/23 at 11:30 AM EDT by a video-enabled telemedicine application and verified that I am speaking with the correct person using two identifiers.  Location: Patient: Virtual Visit  Location Patient: Home Provider: Virtual Visit Location Provider: Home Office   I discussed the limitations of evaluation and management by telemedicine and the availability of in person appointments. The patient expressed understanding and agreed to proceed.    History of Present Illness: Laura Gilmore is a 58 y.o. who identifies as a female who was assigned female at birth, and is being seen today for sinus congestion  Started over a week ago with congestion and stuffy nose and cough, improved for a few days and then started back 2 days ago with increased coughing with mucus that is yellow. Mild headache.  Denies fevers, chills, chest pain, shortness of breath   Problems:  Patient Active Problem List   Diagnosis Date Noted   Weight loss counseling, encounter for 05/28/2023   Family history of colon polyps, unspecified 05/28/2023   Elevated alkaline phosphatase level 05/28/2023   Annual physical exam 01/24/2023   Hypothyroidism 07/13/2022   Arthralgia of both hands 07/13/2022   Type 2 diabetes mellitus with stage 2 chronic kidney disease, without long-term current use of insulin (HCC) 01/05/2018   Hyperlipidemia associated with type 2 diabetes mellitus (HCC) 04/09/2015    Allergies: No Known Allergies Medications:  Current Outpatient Medications:    aspirin  EC 81 MG tablet, Take 81 mg by mouth daily. , Disp: , Rfl:    Blood Glucose Monitoring Suppl DEVI, 1 each by Does not apply route daily before breakfast. May substitute to any manufacturer covered by patient's insurance., Disp: 1 each, Rfl:  0   Glucose Blood (BLOOD GLUCOSE TEST STRIPS) STRP, 1 each by In Vitro route daily before breakfast. May substitute to any manufacturer covered by patient's insurance., Disp: 100 strip, Rfl: 3   Lancet Device MISC, 1 each by Does not apply route daily before breakfast. May substitute to any manufacturer covered by patient's insurance., Disp: 1 each, Rfl: 0   Lancets (ONETOUCH DELICA PLUS  LANCET33G) MISC, SMARTSIG:1 Each Topical Every Morning, Disp: , Rfl:    levonorgestrel (LILETTA) 19.5 MCG/DAY IUD IUD, 1 Intra Uterine Device by Intrauterine route once., Disp: , Rfl:    levothyroxine  (SYNTHROID ) 50 MCG tablet, TAKE 1 TAB ONCE DAILY ON EMPTY STOMACH WITH A GLASS OF WATER AT LEAST 30-60 MINUTES BEFORE BREAKFAST, Disp: 30 tablet, Rfl: 1   meloxicam  (MOBIC ) 15 MG tablet, TAKE 1 TABLET (15 MG TOTAL) BY MOUTH DAILY., Disp: 30 tablet, Rfl: 1   metFORMIN (GLUCOPHAGE-XR) 500 MG 24 hr tablet, TAKE 1 TABLET BY MOUTH EVERY DAY WITH DINNER, Disp: 90 tablet, Rfl: 1   rosuvastatin  (CRESTOR ) 40 MG tablet, Take 1 tablet (40 mg total) by mouth daily., Disp: 90 tablet, Rfl: 3   Semaglutide , 1 MG/DOSE, 4 MG/3ML SOPN, Inject 1 mg into the skin once a week., Disp: 3 mL, Rfl: 2  Observations/Objective: Patient is well-developed, well-nourished in no acute distress.  Resting comfortably  at home.  Head is normocephalic, atraumatic.  No labored breathing.  Speech is clear and coherent with logical content.  Patient is alert and oriented at baseline.  Nasal congestion tone noted   Assessment and Plan:   1. Acute bacterial sinusitis (Primary)  - amoxicillin -clavulanate (AUGMENTIN) 875-125 MG tablet; Take 1 tablet by mouth 2 (two) times daily for 7 days.  Dispense: 14 tablet; Refill: 0 - promethazine -dextromethorphan (PROMETHAZINE -DM) 6.25-15 MG/5ML syrup; Take 5 mLs by mouth 4 (four) times daily as needed for cough.  Dispense: 118 mL; Refill: 0       Reviewed side effects, risks and benefits of medication.    Patient acknowledged agreement and understanding of the plan.   Past Medical, Surgical, Social History, Allergies, and Medications have been Reviewed.    Follow Up Instructions: I discussed the assessment and treatment plan with the patient. The patient was provided an opportunity to ask questions and all were answered. The patient agreed with the plan and demonstrated an  understanding of the instructions.  A copy of instructions were sent to the patient via MyChart unless otherwise noted below.    The patient was advised to call back or seek an in-person evaluation if the symptoms worsen or if the condition fails to improve as anticipated.    [name], NP

## 2023-10-07 ENCOUNTER — Other Ambulatory Visit: Payer: Self-pay | Admitting: Family Medicine

## 2023-10-19 ENCOUNTER — Ambulatory Visit: Admitting: Dietician

## 2023-10-24 ENCOUNTER — Ambulatory Visit
Admission: RE | Admit: 2023-10-24 | Discharge: 2023-10-24 | Disposition: A | Discharge status: Home / Self Care | Source: Ambulatory Visit | Attending: Obstetrics and Gynecology | Admitting: Obstetrics and Gynecology

## 2023-10-24 DIAGNOSIS — Z1231 Encounter for screening mammogram for malignant neoplasm of breast: Secondary | ICD-10-CM | POA: Diagnosis present

## 2023-11-17 ENCOUNTER — Ambulatory Visit: Admitting: Family Medicine

## 2023-11-17 VITALS — BP 119/71 | HR 63 | Resp 16 | Ht 61.0 in | Wt 211.3 lb

## 2023-11-17 DIAGNOSIS — E039 Hypothyroidism, unspecified: Secondary | ICD-10-CM

## 2023-11-17 DIAGNOSIS — E785 Hyperlipidemia, unspecified: Secondary | ICD-10-CM

## 2023-11-17 DIAGNOSIS — Z2839 Other underimmunization status: Secondary | ICD-10-CM

## 2023-11-17 DIAGNOSIS — Z7984 Long term (current) use of oral hypoglycemic drugs: Secondary | ICD-10-CM

## 2023-11-17 DIAGNOSIS — Z79899 Other long term (current) drug therapy: Secondary | ICD-10-CM

## 2023-11-17 DIAGNOSIS — E1169 Type 2 diabetes mellitus with other specified complication: Secondary | ICD-10-CM | POA: Diagnosis not present

## 2023-11-17 DIAGNOSIS — E1122 Type 2 diabetes mellitus with diabetic chronic kidney disease: Secondary | ICD-10-CM | POA: Diagnosis not present

## 2023-11-17 DIAGNOSIS — N182 Chronic kidney disease, stage 2 (mild): Secondary | ICD-10-CM

## 2023-11-17 DIAGNOSIS — M25541 Pain in joints of right hand: Secondary | ICD-10-CM | POA: Diagnosis not present

## 2023-11-17 DIAGNOSIS — E559 Vitamin D deficiency, unspecified: Secondary | ICD-10-CM

## 2023-11-17 DIAGNOSIS — M25542 Pain in joints of left hand: Secondary | ICD-10-CM

## 2023-11-17 DIAGNOSIS — R748 Abnormal levels of other serum enzymes: Secondary | ICD-10-CM

## 2023-11-17 MED ORDER — ROSUVASTATIN CALCIUM 40 MG PO TABS
40.0000 mg | ORAL_TABLET | Freq: Every day | ORAL | 3 refills | Status: AC
Start: 2023-11-17 — End: ?

## 2023-11-17 MED ORDER — SEMAGLUTIDE (1 MG/DOSE) 4 MG/3ML ~~LOC~~ SOPN: 1.0000 mg | PEN_INJECTOR | SUBCUTANEOUS | 2 refills | Status: AC

## 2023-11-17 MED ORDER — METFORMIN HCL ER 500 MG PO TB24: 500.0000 mg | ORAL_TABLET | Freq: Every day | ORAL | 3 refills | Status: AC

## 2023-11-17 NOTE — Patient Instructions (Signed)
 Please call the [facility] [phone]) to schedule a bone density study.

## 2023-11-17 NOTE — Progress Notes (Signed)
 Established patient visit   Patient: Laura Gilmore   DOB: Mar 27, 1966   58 y.o. Female  MRN: [id number] Visit Date: 11/17/2023  Today's healthcare provider: LAURAINE LOISE BUOY, DO   Chief Complaint  Patient presents with   Follow-up   Subjective    HPI KEYIRA MONDESIR is a 58 year old female with type 2 diabetes who presents for follow-up regarding medication tolerance and blood sugar management.  She has been experiencing issues with her diabetes medication, Ozempic . Initially on a 2 mg dose, she suffered from severe indigestion and diarrhea, which made her feel 'miserable' and as if she was having a heart attack. After about a month, she discontinued the 2 mg dose due to these side effects and has since been switched back to a 1 mg dose, which she tolerates better.  Her blood sugar levels have been fluctuating. Her A1c was previously 5.9% but increased to 6.4% after being off Ozempic  for about three weeks due to insurance issues. She continues to take metformin  without any issues of low blood sugar.  She has been more active, engaging in exercise routines from a YouTube channel called '[employer],' which includes standing exercises. This increased activity has led to a reduction in her clothing size, including a decrease in bra and shirt sizes. She has also noticed a difference in the size of her thighs, with the one she injects Ozempic  into appearing smaller. She alternates injection sites between her abdomen and right thigh to avoid bruising and scar tissue but has been avoiding her left thigh due to some scar tissue there.  She has a history of thyroid issues and is on levothyroxine  daily. She also takes rosuvastatin  and metformin  XR, and uses meloxicam  occasionally for arthritis pain in her hands.  No low blood sugar episodes and no history of broken bones.      Medications: Outpatient Medications Prior to Visit  Medication Sig   aspirin  EC 81 MG tablet Take  81 mg by mouth daily.    Blood Glucose Monitoring Suppl DEVI 1 each by Does not apply route daily before breakfast. May substitute to any manufacturer covered by patient's insurance.   Glucose Blood (BLOOD GLUCOSE TEST STRIPS) STRP 1 each by In Vitro route daily before breakfast. May substitute to any manufacturer covered by patient's insurance.   Lancet Device MISC 1 each by Does not apply route daily before breakfast. May substitute to any manufacturer covered by patient's insurance.   Lancets (ONETOUCH DELICA PLUS LANCET33G) MISC SMARTSIG:1 Each Topical Every Morning   levonorgestrel (LILETTA) 19.5 MCG/DAY IUD IUD 1 Intra Uterine Device by Intrauterine route once.   levothyroxine  (SYNTHROID ) 50 MCG tablet TAKE 1 TAB ONCE DAILY ON EMPTY STOMACH WITH A GLASS OF WATER AT LEAST 30-60 MINUTES BEFORE BREAKFAST   meloxicam  (MOBIC ) 15 MG tablet TAKE 1 TABLET (15 MG TOTAL) BY MOUTH DAILY.   [DISCONTINUED] metFORMIN  (GLUCOPHAGE -XR) 500 MG 24 hr tablet TAKE 1 TABLET BY MOUTH EVERY DAY WITH DINNER   [DISCONTINUED] rosuvastatin  (CRESTOR ) 40 MG tablet Take 1 tablet (40 mg total) by mouth daily.   [DISCONTINUED] Semaglutide , 1 MG/DOSE, 4 MG/3ML SOPN Inject 1 mg into the skin once a week.   [DISCONTINUED] promethazine -dextromethorphan (PROMETHAZINE -DM) 6.25-15 MG/5ML syrup Take 5 mLs by mouth 4 (four) times daily as needed for cough.   No facility-administered medications prior to visit.        Objective    BP 119/71 (BP Location: Left Arm, Patient Position: Sitting,  Cuff Size: Large)   Pulse 63   Resp 16   Ht 5' 1 (1.549 m)   Wt 211 lb 4.8 oz (95.8 kg)   SpO2 100%   BMI 39.92 kg/m     Physical Exam Constitutional:      Appearance: Normal appearance.  HENT:     Head: Normocephalic and atraumatic.  Eyes:     General: No scleral icterus.    Extraocular Movements: Extraocular movements intact.     Conjunctiva/sclera: Conjunctivae normal.  Cardiovascular:     Rate and Rhythm: Normal rate and  regular rhythm.     Pulses: Normal pulses.     Heart sounds: Normal heart sounds.  Pulmonary:     Effort: Pulmonary effort is normal. No respiratory distress.     Breath sounds: Normal breath sounds.  Abdominal:     General: Bowel sounds are normal. There is no distension.     Palpations: Abdomen is soft. There is no mass.     Tenderness: There is no abdominal tenderness. There is no guarding.  Musculoskeletal:     Right lower leg: No edema.     Left lower leg: No edema.  Skin:    General: Skin is warm and dry.  Neurological:     Mental Status: She is alert and oriented to person, place, and time. Mental status is at baseline.  Psychiatric:        Mood and Affect: Mood normal.        Behavior: Behavior normal.      Results for orders placed or performed in visit on 11/17/23  Comprehensive metabolic panel with GFR  Result Value Ref Range   Glucose 92 70 - 99 mg/dL   BUN 13 6 - 24 mg/dL   Creatinine, Ser 9.13 0.57 - 1.00 mg/dL   eGFR 79 >40 fO/fpw/8.26   BUN/Creatinine Ratio 15 9 - 23   Sodium 141 134 - 144 mmol/L   Potassium 4.6 3.5 - 5.2 mmol/L   Chloride 102 96 - 106 mmol/L   CO2 21 20 - 29 mmol/L   Calcium  9.7 8.7 - 10.2 mg/dL   Total Protein 7.2 6.0 - 8.5 g/dL   Albumin 4.4 3.8 - 4.9 g/dL   Globulin, Total 2.8 1.5 - 4.5 g/dL   Bilirubin Total 0.5 0.0 - 1.2 mg/dL   Alkaline Phosphatase 146 (H) 44 - 121 IU/L   AST 18 0 - 40 IU/L   ALT 12 0 - 32 IU/L  Hemoglobin A1c  Result Value Ref Range   Hgb A1c MFr Bld 5.8 (H) 4.8 - 5.6 %   Est. average glucose Bld gHb Est-mCnc 120 mg/dL  Lipid panel  Result Value Ref Range   Cholesterol, Total 148 100 - 199 mg/dL   Triglycerides 870 0 - 149 mg/dL   HDL 49 >60 mg/dL   VLDL Cholesterol Cal 23 5 - 40 mg/dL   LDL Chol Calc (NIH) 76 0 - 99 mg/dL   Chol/HDL Ratio 3.0 0.0 - 4.4 ratio  VITAMIN D  25 Hydroxy (Vit-D Deficiency, Fractures)  Result Value Ref Range   Vit D, 25-Hydroxy 59.0 30.0 - 100.0 ng/mL  TSH Rfx on Abnormal to  Free T4  Result Value Ref Range   TSH 2.000 0.450 - 4.500 uIU/mL  Vitamin B12  Result Value Ref Range   Vitamin B-12 382 232 - 1,245 pg/mL    Assessment & Plan    Type 2 diabetes mellitus with stage 2 chronic kidney disease, without long-term current  use of insulin (HCC) -     Comprehensive metabolic panel with GFR -     Hemoglobin A1c -     Vitamin B12 -     metFORMIN  HCl ER; Take 1 tablet (500 mg total) by mouth daily with supper.  Dispense: 90 tablet; Refill: 3 -     Semaglutide  (1 MG/DOSE); Inject 1 mg into the skin once a week.  Dispense: 3 mL; Refill: 2  Hypothyroidism, unspecified type -     TSH Rfx on Abnormal to Free T4  Arthralgia of both hands  Hyperlipidemia associated with type 2 diabetes mellitus (HCC) -     Lipid panel -     Rosuvastatin  Calcium ; Take 1 tablet (40 mg total) by mouth daily.  Dispense: 90 tablet; Refill: 3  Elevated alkaline phosphatase level -     DG Bone Density; Future  Vitamin D  deficiency -     VITAMIN D  25 Hydroxy (Vit-D Deficiency, Fractures) -     DG Bone Density; Future  High risk medication use -     Vitamin B12  Hepatitis B vaccination not up to date      Type 2 Diabetes Mellitus with stage 2 chronic kidney disease Managed with metformin  and semaglutide  1 mg. A1c at 6.4% after discontinuing 2 mg dose due to side effects. Weight loss with increased physical activity. - Continue metformin  and semaglutide  1 mg. - Recommend including left thigh as an alternating injection site, while staying away from scar tissue. - Order blood work including A1c. - Perform foot exam. - Encourage continued physical activity.  Hypothyroidism Managed with levothyroxine . - Continue levothyroxine . - Check thyroid function tests.  Arthralgia of both hands Occasional hand pain managed with meloxicam  as needed. - Continue meloxicam  as needed for arthritis pain.  Osteoporosis Screening; elevated alkaline phosphatase No prior bone density test.  Elevated alkaline phosphatase with normal GGT suggests potential bone turnover. DEXA scan warranted. - Order DEXA scan for bone density assessment.  General Health Maintenance Due for hepatitis B vaccination, COVID booster, and colonoscopy. Previous polyps necessitate follow-up colonoscopy. Vitamin D  level check needed. - Recommend hepatitis B vaccine series; administer at next visit due to not having vaccine in stock today. - Advise scheduling colonoscopy. - Order vitamin D  level. - Discuss COVID booster availability.    Follow-up Follow-up needed for health maintenance and medication refills. - Ensure medication refills for metformin , levothyroxine , and rosuvastatin .  Return in about 6 months (around 05/19/2024) for DM.      I discussed the assessment and treatment plan with the patient  The patient was provided an opportunity to ask questions and all were answered. The patient agreed with the plan and demonstrated an understanding of the instructions.   The patient was advised to call back or seek an in-person evaluation if the symptoms worsen or if the condition fails to improve as anticipated.    LAURAINE LOISE BUOY, DO  [facility] [phone] (phone) [phone] (fax)  [facility]

## 2023-11-18 LAB — TSH RFX ON ABNORMAL TO FREE T4: TSH: 2 u[IU]/mL (ref 0.450–4.500)

## 2023-11-18 LAB — COMPREHENSIVE METABOLIC PANEL WITH GFR
ALT: 12 IU/L (ref 0–32)
AST: 18 IU/L (ref 0–40)
Albumin: 4.4 g/dL (ref 3.8–4.9)
Alkaline Phosphatase: 146 IU/L — ABNORMAL HIGH (ref 44–121)
BUN/Creatinine Ratio: 15 (ref 9–23)
BUN: 13 mg/dL (ref 6–24)
Bilirubin Total: 0.5 mg/dL (ref 0.0–1.2)
CO2: 21 mmol/L (ref 20–29)
Calcium: 9.7 mg/dL (ref 8.7–10.2)
Chloride: 102 mmol/L (ref 96–106)
Creatinine, Ser: 0.86 mg/dL (ref 0.57–1.00)
Globulin, Total: 2.8 g/dL (ref 1.5–4.5)
Glucose: 92 mg/dL (ref 70–99)
Potassium: 4.6 mmol/L (ref 3.5–5.2)
Sodium: 141 mmol/L (ref 134–144)
Total Protein: 7.2 g/dL (ref 6.0–8.5)
eGFR: 79 mL/min/1.73 (ref >59)

## 2023-11-18 LAB — LIPID PANEL
Chol/HDL Ratio: 3 ratio (ref 0.0–4.4)
Cholesterol, Total: 148 mg/dL (ref 100–199)
HDL: 49 mg/dL (ref >39)
LDL Chol Calc (NIH): 76 mg/dL (ref 0–99)
Triglycerides: 129 mg/dL (ref 0–149)
VLDL Cholesterol Cal: 23 mg/dL (ref 5–40)

## 2023-11-18 LAB — HEMOGLOBIN A1C
Est. average glucose Bld gHb Est-mCnc: 120 mg/dL
Hgb A1c MFr Bld: 5.8 % — ABNORMAL HIGH (ref 4.8–5.6)

## 2023-11-18 LAB — VITAMIN B12: Vitamin B-12: 382 pg/mL (ref 232–1245)

## 2023-11-18 LAB — VITAMIN D 25 HYDROXY (VIT D DEFICIENCY, FRACTURES): Vit D, 25-Hydroxy: 59 ng/mL (ref 30.0–100.0)

## 2023-11-22 ENCOUNTER — Ambulatory Visit: Payer: Self-pay | Admitting: Family Medicine

## 2023-11-30 ENCOUNTER — Other Ambulatory Visit: Payer: Self-pay | Admitting: Family Medicine

## 2023-12-06 ENCOUNTER — Telehealth: Admitting: Family Medicine

## 2023-12-06 DIAGNOSIS — U071 COVID-19: Secondary | ICD-10-CM

## 2023-12-06 MED ORDER — PROMETHAZINE-DM 6.25-15 MG/5ML PO SYRP: 5.0000 mL | ORAL_SOLUTION | Freq: Four times a day (QID) | ORAL | 0 refills | Status: DC | PRN

## 2023-12-06 MED ORDER — BENZONATATE 100 MG PO CAPS: 100.0000 mg | ORAL_CAPSULE | Freq: Three times a day (TID) | ORAL | 0 refills | Status: DC | PRN

## 2023-12-06 NOTE — Progress Notes (Signed)
 Virtual Visit Consent   Laura Gilmore, you are scheduled for a virtual visit with a [facility] provider today. Just as with appointments in the office, your consent must be obtained to participate. Your consent will be active for this visit and any virtual visit you may have with one of our providers in the next 365 days. If you have a MyChart account, a copy of this consent can be sent to you electronically.  As this is a virtual visit, video technology does not allow for your provider to perform a traditional examination. This may limit your provider's ability to fully assess your condition. If your provider identifies any concerns that need to be evaluated in person or the need to arrange testing (such as labs, EKG, etc.), we will make arrangements to do so. Although advances in technology are sophisticated, we cannot ensure that it will always work on either your end or our end. If the connection with a video visit is poor, the visit may have to be switched to a telephone visit. With either a video or telephone visit, we are not always able to ensure that we have a secure connection.  By engaging in this virtual visit, you consent to the provision of healthcare and authorize for your insurance to be billed (if applicable) for the services provided during this visit. Depending on your insurance coverage, you may receive a charge related to this service.  I need to obtain your verbal consent now. Are you willing to proceed with your visit today? Laura Gilmore has provided verbal consent on 12/06/2023 for a virtual visit (video or telephone). Laura CHRISTELLA Barefoot, NP  Date: 12/06/2023 3:29 PM   Virtual Visit via Video Note   I, Laura Gilmore, connected with  Laura Gilmore  ([id number], 1965-11-21) on 12/06/23 at  3:30 PM EDT by a video-enabled telemedicine application and verified that I am speaking with the correct person using two identifiers.  Location: Patient: Virtual Visit  Location Patient: Home Provider: Virtual Visit Location Provider: Home Office   I discussed the limitations of evaluation and management by telemedicine and the availability of in person appointments. The patient expressed understanding and agreed to proceed.    History of Present Illness: Laura Gilmore is a 58 y.o. who identifies as a female who was assigned female at birth, and is being seen today for covid +  Onset was yesterday with sore throat and cough and worsen overnight tested positive.   Associated symptoms are chills, and feverish (has not taken temp), headache yesterday  improved today, congestion Modifying factors are day quil, tylenol   Denies chest pain, shortness of breath  Exposure to sick contacts- unknown- but recent travel on cruise  COVID test: + today   Problems:  Patient Active Problem List   Diagnosis Date Noted   Weight loss counseling, encounter for 05/28/2023   Family history of colon polyps, unspecified 05/28/2023   Elevated alkaline phosphatase level 05/28/2023   Annual physical exam 01/24/2023   Hypothyroidism 07/13/2022   Arthralgia of both hands 07/13/2022   Type 2 diabetes mellitus with stage 2 chronic kidney disease, without long-term current use of insulin (HCC) 01/05/2018   Hyperlipidemia associated with type 2 diabetes mellitus (HCC) 04/09/2015    Allergies: No Known Allergies Medications:  Current Outpatient Medications:    aspirin  EC 81 MG tablet, Take 81 mg by mouth daily. , Disp: , Rfl:    Blood Glucose Monitoring Suppl DEVI, 1 each by Does not  apply route daily before breakfast. May substitute to any manufacturer covered by patient's insurance., Disp: 1 each, Rfl: 0   Glucose Blood (BLOOD GLUCOSE TEST STRIPS) STRP, 1 each by In Vitro route daily before breakfast. May substitute to any manufacturer covered by patient's insurance., Disp: 100 strip, Rfl: 3   Lancet Device MISC, 1 each by Does not apply route daily before breakfast. May  substitute to any manufacturer covered by patient's insurance., Disp: 1 each, Rfl: 0   Lancets (ONETOUCH DELICA PLUS LANCET33G) MISC, SMARTSIG:1 Each Topical Every Morning, Disp: , Rfl:    levonorgestrel (LILETTA) 19.5 MCG/DAY IUD IUD, 1 Intra Uterine Device by Intrauterine route once., Disp: , Rfl:    levothyroxine  (SYNTHROID ) 50 MCG tablet, TAKE 1 TAB ONCE DAILY ON EMPTY STOMACH WITH A GLASS OF WATER AT LEAST 30-60 MINUTES BEFORE BREAKFAST, Disp: 90 tablet, Rfl: 1   meloxicam  (MOBIC ) 15 MG tablet, TAKE 1 TABLET (15 MG TOTAL) BY MOUTH DAILY., Disp: 30 tablet, Rfl: 1   metFORMIN  (GLUCOPHAGE -XR) 500 MG 24 hr tablet, Take 1 tablet (500 mg total) by mouth daily with supper., Disp: 90 tablet, Rfl: 3   rosuvastatin  (CRESTOR ) 40 MG tablet, Take 1 tablet (40 mg total) by mouth daily., Disp: 90 tablet, Rfl: 3   Semaglutide , 1 MG/DOSE, 4 MG/3ML SOPN, Inject 1 mg into the skin once a week., Disp: 3 mL, Rfl: 2  Observations/Objective: Patient is well-developed, well-nourished in no acute distress.  Resting comfortably  at home.  Head is normocephalic, atraumatic.  No labored breathing.  Speech is clear and coherent with logical content.  Patient is alert and oriented at baseline.    Assessment and Plan:  1. COVID-19 (Primary)  - promethazine -dextromethorphan (PROMETHAZINE -DM) 6.25-15 MG/5ML syrup; Take 5 mLs by mouth 4 (four) times daily as needed for cough.  Dispense: 118 mL; Refill: 0 - benzonatate  (TESSALON ) 100 MG capsule; Take 1 capsule (100 mg total) by mouth 3 (three) times daily as needed for cough.  Dispense: 30 capsule; Refill: 0   - Continue OTC symptomatic management of choice - Info for COVID sent on AVS as well - Take prescribed medications as directed - Push fluids - Rest as needed - Discussed return precautions and when to seek in-person evaluation, sent via AVS as well  Reviewed side effects, risks and benefits of medication.    Patient acknowledged agreement and  understanding of the plan.   Past Medical, Surgical, Social History, Allergies, and Medications have been Reviewed.    Follow Up Instructions: I discussed the assessment and treatment plan with the patient. The patient was provided an opportunity to ask questions and all were answered. The patient agreed with the plan and demonstrated an understanding of the instructions.  A copy of instructions were sent to the patient via MyChart unless otherwise noted below.    The patient was advised to call back or seek an in-person evaluation if the symptoms worsen or if the condition fails to improve as anticipated.    Laura CHRISTELLA Barefoot, NP

## 2023-12-06 NOTE — Patient Instructions (Signed)
 Laura Gilmore, thank you for joining [name], NP for today's virtual visit.  While this provider is not your primary care provider (PCP), if your PCP is located in our provider database this encounter information will be shared with them immediately following your visit.   A [facility] MyChart account gives you access to today's visit and all your visits, tests, and labs performed at [facility]  click here if you don't have a [facility] MyChart account or go to [email or web address]  Consent: (Patient) Laura Gilmore provided verbal consent for this virtual visit at the beginning of the encounter.  Current Medications:  Current Outpatient Medications:    benzonatate  (TESSALON ) 100 MG capsule, Take 1 capsule (100 mg total) by mouth 3 (three) times daily as needed for cough., Disp: 30 capsule, Rfl: 0   promethazine -dextromethorphan (PROMETHAZINE -DM) 6.25-15 MG/5ML syrup, Take 5 mLs by mouth 4 (four) times daily as needed for cough., Disp: 118 mL, Rfl: 0   aspirin  EC 81 MG tablet, Take 81 mg by mouth daily. , Disp: , Rfl:    Blood Glucose Monitoring Suppl DEVI, 1 each by Does not apply route daily before breakfast. May substitute to any manufacturer covered by patient's insurance., Disp: 1 each, Rfl: 0   Glucose Blood (BLOOD GLUCOSE TEST STRIPS) STRP, 1 each by In Vitro route daily before breakfast. May substitute to any manufacturer covered by patient's insurance., Disp: 100 strip, Rfl: 3   Lancet Device MISC, 1 each by Does not apply route daily before breakfast. May substitute to any manufacturer covered by patient's insurance., Disp: 1 each, Rfl: 0   Lancets (ONETOUCH DELICA PLUS LANCET33G) MISC, SMARTSIG:1 Each Topical Every Morning, Disp: , Rfl:    levonorgestrel (LILETTA) 19.5 MCG/DAY IUD IUD, 1 Intra Uterine Device by Intrauterine route once., Disp: , Rfl:    levothyroxine  (SYNTHROID ) 50 MCG tablet, TAKE 1 TAB ONCE DAILY ON EMPTY STOMACH WITH A GLASS  OF WATER AT LEAST 30-60 MINUTES BEFORE BREAKFAST, Disp: 90 tablet, Rfl: 1   meloxicam  (MOBIC ) 15 MG tablet, TAKE 1 TABLET (15 MG TOTAL) BY MOUTH DAILY., Disp: 30 tablet, Rfl: 1   metFORMIN  (GLUCOPHAGE -XR) 500 MG 24 hr tablet, Take 1 tablet (500 mg total) by mouth daily with supper., Disp: 90 tablet, Rfl: 3   rosuvastatin  (CRESTOR ) 40 MG tablet, Take 1 tablet (40 mg total) by mouth daily., Disp: 90 tablet, Rfl: 3   Semaglutide , 1 MG/DOSE, 4 MG/3ML SOPN, Inject 1 mg into the skin once a week., Disp: 3 mL, Rfl: 2   Medications ordered in this encounter:  Meds ordered this encounter  Medications   promethazine -dextromethorphan (PROMETHAZINE -DM) 6.25-15 MG/5ML syrup    Sig: Take 5 mLs by mouth 4 (four) times daily as needed for cough.    Dispense:  118 mL    Refill:  0    Supervising Provider:   BLAISE ALEENE KIDD [[id number]]   benzonatate  (TESSALON ) 100 MG capsule    Sig: Take 1 capsule (100 mg total) by mouth 3 (three) times daily as needed for cough.    Dispense:  30 capsule    Refill:  0    Supervising Provider:   BLAISE ALEENE KIDD [[id number]]     *If you need refills on other medications prior to your next appointment, please contact your pharmacy*  Follow-Up: Call back or seek an in-person evaluation if the symptoms worsen or if the condition fails to improve as anticipated.  [facility] [phone]  Care Instructions:   -  Continue OTC symptomatic management of choice - Info for COVID sent on AVS as well - Take prescribed medications as directed - Push fluids - Rest as needed - Discussed return precautions and when to seek in-person evaluation, sent via AVS as well     Isolation Instructions: You are to isolate at home until you have been fever free for at least 24 hours without a fever-reducing medication, and symptoms have been steadily improving for 24 hours. At that time,  you can end isolation but need to mask for an additional 5 days.   If you must be  around other household members who do not have symptoms, you need to make sure that both you and the family members are masking consistently with a high-quality mask.  If you note any worsening of symptoms despite treatment, please seek an in-person evaluation ASAP. If you note any significant shortness of breath or any chest pain, please seek ER evaluation. Please do not delay care!   COVID-19: What to Do if You Are Sick If you test positive and are an older adult or someone who is at high risk of getting very sick from COVID-19, treatment may be available. Contact a healthcare provider right away after a positive test to determine if you are eligible, even if your symptoms are mild right now. You can also visit a Test to Treat location and, if eligible, receive a prescription from a provider. Don't delay: Treatment must be started within the first few days to be effective. If you have a fever, cough, or other symptoms, you might have COVID-19. Most people have mild illness and are able to recover at home. If you are sick: Keep track of your symptoms. If you have an emergency warning sign (including trouble breathing), call 911. Steps to help prevent the spread of COVID-19 if you are sick If you are sick with COVID-19 or think you might have COVID-19, follow the steps below to care for yourself and to help protect other people in your home and community. Stay home except to get medical care Stay home. Most people with COVID-19 have mild illness and can recover at home without medical care. Do not leave your home, except to get medical care. Do not visit public areas and do not go to places where you are unable to wear a mask. Take care of yourself. Get rest and stay hydrated. Take over-the-counter medicines, such as acetaminophen , to help you feel better. Stay in touch with your doctor. Call before you get medical care. Be sure to get care if you have trouble breathing, or have any other emergency  warning signs, or if you think it is an emergency. Avoid public transportation, ride-sharing, or taxis if possible. Get tested If you have symptoms of COVID-19, get tested. While waiting for test results, stay away from others, including staying apart from those living in your household. Get tested as soon as possible after your symptoms start. Treatments may be available for people with COVID-19 who are at risk for becoming very sick. Don't delay: Treatment must be started early to be effective--some treatments must begin within 5 days of your first symptoms. Contact your healthcare provider right away if your test result is positive to determine if you are eligible. Self-tests are one of several options for testing for the virus that causes COVID-19 and may be more convenient than laboratory-based tests and point-of-care tests. Ask your healthcare provider or your local health department if you need help interpreting your  test results. You can visit your state, tribal, local, and territorial health department's website to look for the latest local information on testing sites. Separate yourself from other people As much as possible, stay in a specific room and away from other people and pets in your home. If possible, you should use a separate bathroom. If you need to be around other people or animals in or outside of the home, wear a well-fitting mask. Tell your close contacts that they may have been exposed to COVID-19. An infected person can spread COVID-19 starting 48 hours (or 2 days) before the person has any symptoms or tests positive. By letting your close contacts know they may have been exposed to COVID-19, you are helping to protect everyone. See COVID-19 and Animals if you have questions about pets. If you are diagnosed with COVID-19, someone from the health department may call you. Answer the call to slow the spread. Monitor your symptoms Symptoms of COVID-19 include fever, cough, or  other symptoms. Follow care instructions from your healthcare provider and local health department. Your local health authorities may give instructions on checking your symptoms and reporting information. When to seek emergency medical attention Look for emergency warning signs* for COVID-19. If someone is showing any of these signs, seek emergency medical care immediately: Trouble breathing Persistent pain or pressure in the chest New confusion Inability to wake or stay awake Pale, gray, or blue-colored skin, lips, or nail beds, depending on skin tone *This list is not all possible symptoms. Please call your medical provider for any other symptoms that are severe or concerning to you. Call 911 or call ahead to your local emergency facility: Notify the operator that you are seeking care for someone who has or may have COVID-19. Call ahead before visiting your doctor Call ahead. Many medical visits for routine care are being postponed or done by phone or telemedicine. If you have a medical appointment that cannot be postponed, call your doctor's office, and tell them you have or may have COVID-19. This will help the office protect themselves and other patients. If you are sick, wear a well-fitting mask You should wear a mask if you must be around other people or animals, including pets (even at home). Wear a mask with the best fit, protection, and comfort for you. You don't need to wear the mask if you are alone. If you can't put on a mask (because of trouble breathing, for example), cover your coughs and sneezes in some other way. Try to stay at least 6 feet away from other people. This will help protect the people around you. Masks should not be placed on young children under age 66 years, anyone who has trouble breathing, or anyone who is not able to remove the mask without help. Cover your coughs and sneezes Cover your mouth and nose with a tissue when you cough or sneeze. Throw away used  tissues in a lined trash can. Immediately wash your hands with soap and water for at least 20 seconds. If soap and water are not available, clean your hands with an alcohol-based hand sanitizer that contains at least 60% alcohol. Clean your hands often Wash your hands often with soap and water for at least 20 seconds. This is especially important after blowing your nose, coughing, or sneezing; going to the bathroom; and before eating or preparing food. Use hand sanitizer if soap and water are not available. Use an alcohol-based hand sanitizer with at least 60% alcohol, covering all  surfaces of your hands and rubbing them together until they feel dry. Soap and water are the best option, especially if hands are visibly dirty. Avoid touching your eyes, nose, and mouth with unwashed hands. Handwashing Tips Avoid sharing personal household items Do not share dishes, drinking glasses, cups, eating utensils, towels, or bedding with other people in your home. Wash these items thoroughly after using them with soap and water or put in the dishwasher. Clean surfaces in your home regularly Clean and disinfect high-touch surfaces (for example, doorknobs, tables, handles, light switches, and countertops) in your sick room and bathroom. In shared spaces, you should clean and disinfect surfaces and items after each use by the person who is ill. If you are sick and cannot clean, a caregiver or other person should only clean and disinfect the area around you (such as your bedroom and bathroom) on an as needed basis. Your caregiver/other person should wait as long as possible (at least several hours) and wear a mask before entering, cleaning, and disinfecting shared spaces that you use. Clean and disinfect areas that may have blood, stool, or body fluids on them. Use household cleaners and disinfectants. Clean visible dirty surfaces with household cleaners containing soap or detergent. Then, use a household  disinfectant. Use a product from [employer] List N: Disinfectants for Coronavirus (COVID-19). Be sure to follow the instructions on the label to ensure safe and effective use of the product. Many products recommend keeping the surface wet with a disinfectant for a certain period of time (look at contact time on the product label). You may also need to wear personal protective equipment, such as gloves, depending on the directions on the product label. Immediately after disinfecting, wash your hands with soap and water for 20 seconds. For completed guidance on cleaning and disinfecting your home, visit Complete Disinfection Guidance. Take steps to improve ventilation at home Improve ventilation (air flow) at home to help prevent from spreading COVID-19 to other people in your household. Clear out COVID-19 virus particles in the air by opening windows, using air filters, and turning on fans in your home. Use this interactive tool to learn how to improve air flow in your home. When you can be around others after being sick with COVID-19 Deciding when you can be around others is different for different situations. Find out when you can safely end home isolation. For any additional questions about your care, contact your healthcare provider or state or local health department. 07/29/2020 Content source: [facility] for Immunization and Respiratory Diseases ([facility]), Division of Viral Diseases This information is not intended to replace advice given to you by your health care provider. Make sure you discuss any questions you have with your health care provider. Document Revised: 09/11/2020 Document Reviewed: 09/11/2020 Elsevier Patient Education  2022 [employer].     If you have been instructed to have an in-person evaluation today at a local Urgent Care facility, please use the link below. It will take you to a list of all of our available [facility], including address, phone number  and hours of operation. Please do not delay care.  [facility]  If you or a family member do not have a primary care provider, use the link below to schedule a visit and establish care. When you choose a [facility] primary care physician or advanced practice provider, you gain a long-term partner in health. Find a Primary Care Provider  Learn more about [facility] in-office  and virtual care options: [facility] - Get Care Now

## 2023-12-21 ENCOUNTER — Telehealth: Payer: Self-pay | Admitting: Family Medicine

## 2023-12-21 NOTE — Telephone Encounter (Signed)
 Form is being sent back for completion from [facility].   Please fax when completed to # on form.

## 2023-12-28 ENCOUNTER — Telehealth: Admitting: Physician Assistant

## 2023-12-28 DIAGNOSIS — A084 Viral intestinal infection, unspecified: Secondary | ICD-10-CM

## 2023-12-28 MED ORDER — ONDANSETRON 4 MG PO TBDP: 4.0000 mg | ORAL_TABLET | Freq: Three times a day (TID) | ORAL | 0 refills | Status: AC | PRN

## 2023-12-28 NOTE — Progress Notes (Signed)
 Virtual Visit Consent   Laura Gilmore, you are scheduled for a virtual visit with a [facility] provider today. Just as with appointments in the office, your consent must be obtained to participate. Your consent will be active for this visit and any virtual visit you may have with one of our providers in the next 365 days. If you have a MyChart account, a copy of this consent can be sent to you electronically.  As this is a virtual visit, video technology does not allow for your provider to perform a traditional examination. This may limit your provider's ability to fully assess your condition. If your provider identifies any concerns that need to be evaluated in person or the need to arrange testing (such as labs, EKG, etc.), we will make arrangements to do so. Although advances in technology are sophisticated, we cannot ensure that it will always work on either your end or our end. If the connection with a video visit is poor, the visit may have to be switched to a telephone visit. With either a video or telephone visit, we are not always able to ensure that we have a secure connection.  By engaging in this virtual visit, you consent to the provision of healthcare and authorize for your insurance to be billed (if applicable) for the services provided during this visit. Depending on your insurance coverage, you may receive a charge related to this service.  I need to obtain your verbal consent now. Are you willing to proceed with your visit today? Laura Gilmore has provided verbal consent on 12/28/2023 for a virtual visit (video or telephone). Laura Gilmore, [place]  Date: 12/28/2023 12:05 PM   Virtual Visit via Video Note   I, Laura Gilmore, connected with  Laura Gilmore  ([id number], 10/03/1965) on 12/28/23 at 11:45 AM EDT by a video-enabled telemedicine application and verified that I am speaking with the correct person using two identifiers.  Location: Patient:  Virtual Visit Location Patient: Home Provider: Virtual Visit Location Provider: Home Office   I discussed the limitations of evaluation and management by telemedicine and the availability of in person appointments. The patient expressed understanding and agreed to proceed.    History of Present Illness: Laura Gilmore is a 58 y.o. who identifies as a female who was assigned female at birth, and is being seen today for 2 days of nausea, vomiting and diarrhea. Notes hydrating (water only) but unable to keep food down. Denies fever, chills, melena or hematochezia. Mild headache. Denies recent travel or known sick contact. Notes last emesis -- 2AM -- dry heaves.   Denies abnormal food or water sources.  No known sick contact.    HPI: HPI  Problems:  Patient Active Problem List   Diagnosis Date Noted   Weight loss counseling, encounter for 05/28/2023   Family history of colon polyps, unspecified 05/28/2023   Elevated alkaline phosphatase level 05/28/2023   Annual physical exam 01/24/2023   Hypothyroidism 07/13/2022   Arthralgia of both hands 07/13/2022   Type 2 diabetes mellitus with stage 2 chronic kidney disease, without long-term current use of insulin (HCC) 01/05/2018   Hyperlipidemia associated with type 2 diabetes mellitus (HCC) 04/09/2015    Allergies: No Known Allergies Medications:  Current Outpatient Medications:    ondansetron  (ZOFRAN -ODT) 4 MG disintegrating tablet, Take 1 tablet (4 mg total) by mouth every 8 (eight) hours as needed for nausea or vomiting., Disp: 20 tablet, Rfl: 0   aspirin  EC 81 MG  tablet, Take 81 mg by mouth daily. , Disp: , Rfl:    Blood Glucose Monitoring Suppl DEVI, 1 each by Does not apply route daily before breakfast. May substitute to any manufacturer covered by patient's insurance., Disp: 1 each, Rfl: 0   Glucose Blood (BLOOD GLUCOSE TEST STRIPS) STRP, 1 each by In Vitro route daily before breakfast. May substitute to any manufacturer covered by  patient's insurance., Disp: 100 strip, Rfl: 3   Lancet Device MISC, 1 each by Does not apply route daily before breakfast. May substitute to any manufacturer covered by patient's insurance., Disp: 1 each, Rfl: 0   Lancets (ONETOUCH DELICA PLUS LANCET33G) MISC, SMARTSIG:1 Each Topical Every Morning, Disp: , Rfl:    levonorgestrel (LILETTA) 19.5 MCG/DAY IUD IUD, 1 Intra Uterine Device by Intrauterine route once., Disp: , Rfl:    levothyroxine  (SYNTHROID ) 50 MCG tablet, TAKE 1 TAB ONCE DAILY ON EMPTY STOMACH WITH A GLASS OF WATER AT LEAST 30-60 MINUTES BEFORE BREAKFAST, Disp: 90 tablet, Rfl: 1   meloxicam  (MOBIC ) 15 MG tablet, TAKE 1 TABLET (15 MG TOTAL) BY MOUTH DAILY., Disp: 30 tablet, Rfl: 1   metFORMIN  (GLUCOPHAGE -XR) 500 MG 24 hr tablet, Take 1 tablet (500 mg total) by mouth daily with supper., Disp: 90 tablet, Rfl: 3   rosuvastatin  (CRESTOR ) 40 MG tablet, Take 1 tablet (40 mg total) by mouth daily., Disp: 90 tablet, Rfl: 3   Semaglutide , 1 MG/DOSE, 4 MG/3ML SOPN, Inject 1 mg into the skin once a week., Disp: 3 mL, Rfl: 2  Observations/Objective: Patient is well-developed, well-nourished in no acute distress.  Resting comfortably at home.  Head is normocephalic, atraumatic.  No labored breathing. Speech is clear and coherent with logical content.  Patient is alert and oriented at baseline.   Assessment and Plan: 1. Viral gastroenteritis (Primary) - ondansetron  (ZOFRAN -ODT) 4 MG disintegrating tablet; Take 1 tablet (4 mg total) by mouth every 8 (eight) hours as needed for nausea or vomiting.  Dispense: 20 tablet; Refill: 0  Supportive measures and OTC medications reviewed.  Imodium OTC. Start [employer]  Zofran  per orders. Work note provided. If any non-resolving, new, or worsening symptoms despite treatment, she is to seek an in-person evaluation ASAP.   Follow Up Instructions: I discussed the assessment and treatment plan with the patient. The patient was provided an opportunity to  ask questions and all were answered. The patient agreed with the plan and demonstrated an understanding of the instructions.  A copy of instructions were sent to the patient via MyChart unless otherwise noted below.   The patient was advised to call back or seek an in-person evaluation if the symptoms worsen or if the condition fails to improve as anticipated.    Laura Velma Lunger, PA-C

## 2023-12-28 NOTE — Patient Instructions (Signed)
 Laura Gilmore, thank you for joining [name], PA-C for today's virtual visit.  While this provider is not your primary care provider (PCP), if your PCP is located in our provider database this encounter information will be shared with them immediately following your visit.   A [facility] MyChart account gives you access to today's visit and all your visits, tests, and labs performed at [facility]  click here if you don't have a [facility] MyChart account or go to [email or web address]  Consent: (Patient) Laura Gilmore provided verbal consent for this virtual visit at the beginning of the encounter.  Current Medications:  Current Outpatient Medications:    ondansetron  (ZOFRAN -ODT) 4 MG disintegrating tablet, Take 1 tablet (4 mg total) by mouth every 8 (eight) hours as needed for nausea or vomiting., Disp: 20 tablet, Rfl: 0   aspirin  EC 81 MG tablet, Take 81 mg by mouth daily. , Disp: , Rfl:    Blood Glucose Monitoring Suppl DEVI, 1 each by Does not apply route daily before breakfast. May substitute to any manufacturer covered by patient's insurance., Disp: 1 each, Rfl: 0   Glucose Blood (BLOOD GLUCOSE TEST STRIPS) STRP, 1 each by In Vitro route daily before breakfast. May substitute to any manufacturer covered by patient's insurance., Disp: 100 strip, Rfl: 3   Lancet Device MISC, 1 each by Does not apply route daily before breakfast. May substitute to any manufacturer covered by patient's insurance., Disp: 1 each, Rfl: 0   Lancets (ONETOUCH DELICA PLUS LANCET33G) MISC, SMARTSIG:1 Each Topical Every Morning, Disp: , Rfl:    levonorgestrel (LILETTA) 19.5 MCG/DAY IUD IUD, 1 Intra Uterine Device by Intrauterine route once., Disp: , Rfl:    levothyroxine  (SYNTHROID ) 50 MCG tablet, TAKE 1 TAB ONCE DAILY ON EMPTY STOMACH WITH A GLASS OF WATER AT LEAST 30-60 MINUTES BEFORE BREAKFAST, Disp: 90 tablet, Rfl: 1   meloxicam  (MOBIC ) 15 MG tablet, TAKE 1 TABLET (15  MG TOTAL) BY MOUTH DAILY., Disp: 30 tablet, Rfl: 1   metFORMIN  (GLUCOPHAGE -XR) 500 MG 24 hr tablet, Take 1 tablet (500 mg total) by mouth daily with supper., Disp: 90 tablet, Rfl: 3   rosuvastatin  (CRESTOR ) 40 MG tablet, Take 1 tablet (40 mg total) by mouth daily., Disp: 90 tablet, Rfl: 3   Semaglutide , 1 MG/DOSE, 4 MG/3ML SOPN, Inject 1 mg into the skin once a week., Disp: 3 mL, Rfl: 2   Medications ordered in this encounter:  Meds ordered this encounter  Medications   ondansetron  (ZOFRAN -ODT) 4 MG disintegrating tablet    Sig: Take 1 tablet (4 mg total) by mouth every 8 (eight) hours as needed for nausea or vomiting.    Dispense:  20 tablet    Refill:  0    Supervising Provider:   LAMPTEY, PHILIP O [[id number]]     *If you need refills on other medications prior to your next appointment, please contact your pharmacy*  Follow-Up: Call back or seek an in-person evaluation if the symptoms worsen or if the condition fails to improve as anticipated.   Virtual Care [phone]  Other Instructions  Please hydrate and rest. Start the dietary recommendations below once your appetite is returning. Imodium OTC to help with diarrhea. Take the Zofran  as directed.  If you note any non-resolving, new, or worsening symptoms despite treatment, please seek an in-person evaluation ASAP.   Viral Gastroenteritis, Adult  Viral gastroenteritis is also known as the stomach flu. This condition may affect your stomach, your small  intestine, and your large intestine. It can cause sudden watery poop (diarrhea), fever, and vomiting. This condition is caused by certain germs (viruses). These germs can be passed from person to person very easily (are contagious). Having watery poop and vomiting can make you feel weak and cause you to not have enough water in your body (get dehydrated). This can make you tired and thirsty, make you have a dry mouth, and make it so you pee (urinate) less often. It is  important to replace the fluids that you lose from having watery poop and vomiting. What are the causes? You can get sick by catching germs from other people. You can also get sick by: Eating food, drinking water, or touching a surface that has the germs on it (is contaminated). Sharing utensils or other personal items with a person who is sick. What increases the risk? Having a weak body defense system (immune system). Living with one or more children who are younger than 2 years. Living in a nursing home. Going on cruise ships. What are the signs or symptoms? Symptoms of this condition start suddenly. Symptoms may last for a few days or for as long as a week. Common symptoms include: Watery poop. Vomiting. Other symptoms include: Fever. Headache. Feeling tired (fatigue). Pain in the belly (abdomen). Chills. Feeling weak. Feeling like you may vomit (nauseous). Muscle aches. Not feeling hungry. How is this treated? This condition typically goes away on its own. The focus of treatment is to replace the fluids that you lose. This condition may be treated with: An ORS (oral rehydration solution). This is a drink that helps you replace fluids and minerals your body lost. It is sold at pharmacies and stores. Medicines to help with your symptoms. Probiotic supplements to reduce symptoms of watery poop. Fluids given through an IV tube, if needed. Older adults and people with other diseases or a weak body defense system are at higher risk for not having enough water in the body. Follow these instructions at home: Eating and drinking  Take an ORS as told by your doctor. Drink clear fluids in small amounts as you are able. Clear fluids include: Water. Ice chips. Fruit juice that has water added to it (is diluted). Low-calorie sports drinks. Drink enough fluid to keep your pee (urine) pale yellow. Eat small amounts of healthy foods every 3-4 hours as you are able. This may include  whole grains, fruits, vegetables, lean meats, and yogurt. Avoid fluids that have a lot of sugar or caffeine in them. This includes energy drinks, sports drinks, and soda. Avoid spicy or fatty foods. Avoid alcohol. General instructions  Wash your hands often. This is very important after you have watery poop or you vomit. If you cannot use soap and water, use hand sanitizer. Make sure that all people in your home wash their hands well and often. Take over-the-counter and prescription medicines only as told by your doctor. Rest at home while you get better. Watch your condition for any changes. Take a warm bath to help with any burning or pain from having watery poop. Keep all follow-up visits. Contact a doctor if: You cannot keep fluids down. Your symptoms get worse. You have new symptoms. You feel light-headed or dizzy. You have muscle cramps. Get help right away if: You have chest pain. You have trouble breathing, or you are breathing very fast. You have a fast heartbeat. You feel very weak or you faint. You have a very bad headache, a stiff  neck, or both. You have a rash. You have very bad pain, cramping, or bloating in your belly. Your skin feels cold and clammy. You feel mixed up (confused). You have pain when you pee. You have signs of not having enough water in the body, such as: Dark pee, hardly any pee, or no pee. Cracked lips. Dry mouth. Sunken eyes. Feeling very sleepy. Feeling weak. You have signs of bleeding, such as: You see blood in your vomit. Your vomit looks like coffee grounds. You have bloody or black poop or poop that looks like tar. These symptoms may be an emergency. Get help right away. Call 911. Do not wait to see if the symptoms will go away. Do not drive yourself to the hospital. Summary Viral gastroenteritis is also known as the stomach flu. This condition can cause sudden watery poop (diarrhea), fever, and vomiting. These germs can be passed  from person to person very easily. Take an ORS (oral rehydration solution) as told by your doctor. This is a drink that is sold at pharmacies and stores. Wash your hands often, especially after having watery poop or vomiting. If you cannot use soap and water, use hand sanitizer. This information is not intended to replace advice given to you by your health care provider. Make sure you discuss any questions you have with your health care provider. Document Revised: 02/23/2021 Document Reviewed: 02/23/2021 Elsevier Patient Education  2024 Elsevier Inc.   If you have been instructed to have an in-person evaluation today at a local Urgent Care facility, please use the link below. It will take you to a list of all of our available [facility], including address, phone number and hours of operation. Please do not delay care.  [facility]  If you or a family member do not have a primary care provider, use the link below to schedule a visit and establish care. When you choose a [facility] primary care physician or advanced practice provider, you gain a long-term partner in health. Find a Primary Care Provider  Learn more about [facility]'s in-office and virtual care options: [facility] - Get Care Now

## 2024-05-18 ENCOUNTER — Ambulatory Visit: Admitting: Family Medicine

## 2024-05-18 ENCOUNTER — Encounter: Payer: Self-pay | Admitting: Family Medicine

## 2024-05-18 VITALS — BP 115/71 | HR 65 | Resp 16 | Ht 61.0 in | Wt 224.3 lb

## 2024-05-18 DIAGNOSIS — Z1159 Encounter for screening for other viral diseases: Secondary | ICD-10-CM

## 2024-05-18 DIAGNOSIS — E039 Hypothyroidism, unspecified: Secondary | ICD-10-CM | POA: Diagnosis not present

## 2024-05-18 DIAGNOSIS — N182 Chronic kidney disease, stage 2 (mild): Secondary | ICD-10-CM | POA: Diagnosis not present

## 2024-05-18 DIAGNOSIS — R748 Abnormal levels of other serum enzymes: Secondary | ICD-10-CM | POA: Diagnosis not present

## 2024-05-18 DIAGNOSIS — E785 Hyperlipidemia, unspecified: Secondary | ICD-10-CM | POA: Diagnosis not present

## 2024-05-18 DIAGNOSIS — M19042 Primary osteoarthritis, left hand: Secondary | ICD-10-CM | POA: Diagnosis not present

## 2024-05-18 DIAGNOSIS — Z7985 Long-term (current) use of injectable non-insulin antidiabetic drugs: Secondary | ICD-10-CM

## 2024-05-18 DIAGNOSIS — N951 Menopausal and female climacteric states: Secondary | ICD-10-CM | POA: Diagnosis not present

## 2024-05-18 DIAGNOSIS — E1122 Type 2 diabetes mellitus with diabetic chronic kidney disease: Secondary | ICD-10-CM

## 2024-05-18 DIAGNOSIS — Z114 Encounter for screening for human immunodeficiency virus [HIV]: Secondary | ICD-10-CM

## 2024-05-18 DIAGNOSIS — L659 Nonscarring hair loss, unspecified: Secondary | ICD-10-CM

## 2024-05-18 DIAGNOSIS — Z23 Encounter for immunization: Secondary | ICD-10-CM | POA: Diagnosis not present

## 2024-05-18 DIAGNOSIS — E1169 Type 2 diabetes mellitus with other specified complication: Secondary | ICD-10-CM | POA: Diagnosis not present

## 2024-05-18 DIAGNOSIS — Z7984 Long term (current) use of oral hypoglycemic drugs: Secondary | ICD-10-CM | POA: Diagnosis not present

## 2024-05-18 NOTE — Progress Notes (Signed)
 "     Established patient visit   Patient: Laura Gilmore   DOB: 1965-08-19   59 y.o. Female  MRN: [id number] Visit Date: 05/18/2024  Today's healthcare provider: LAURAINE LOISE BUOY, DO   Chief Complaint  Patient presents with   Medical Management of Chronic Issues    T2DM follow-up   Subjective    HPI Laura Gilmore is a 59 year old female who presents for follow-up on diabetes management.  Blood glucose levels have been stable, ranging from 90 to 132 mg/dL. She is currently on Ozempic  1 mg weekly. She previously experienced sickness with a dose increase to 2 mg, necessitating decreasing back to 1 mg. No symptoms of hypoglycemia, dizziness, vision changes, chest pain, shortness of breath, lightheadedness, or headaches.  She experiences arthritis pain in her knuckles, for which she takes meloxicam  15 mg, providing partial relief.  She has noticed hair thinning on top, which her hairdresser suggested could be related to medication or menopause. She has been trying different shampoos to address this issue. She has an IUD and has not had a period since its insertion over two years ago.  Her insurance does not cover a bone density test or test strips for her blood glucose meter.      Medications: Show/hide medication list[1]       Objective    BP 115/71 (BP Location: Left Arm, Patient Position: Sitting, Cuff Size: Large)   Pulse 65   Resp 16   Ht 5' 1 (1.549 m)   Wt 224 lb 4.8 oz (101.7 kg)   SpO2 100%   BMI 42.38 kg/m     Physical Exam Constitutional:      Appearance: Normal appearance.  HENT:     Head: Normocephalic and atraumatic.  Eyes:     General: No scleral icterus.    Extraocular Movements: Extraocular movements intact.     Conjunctiva/sclera: Conjunctivae normal.  Cardiovascular:     Rate and Rhythm: Normal rate and regular rhythm.     Pulses: Normal pulses.     Heart sounds: Normal heart sounds.  Pulmonary:     Effort: Pulmonary effort is  normal. No respiratory distress.     Breath sounds: Normal breath sounds.  Abdominal:     General: Bowel sounds are normal.     Palpations: Abdomen is soft.  Musculoskeletal:     Right lower leg: No edema.     Left lower leg: No edema.  Skin:    General: Skin is warm and dry.  Neurological:     Mental Status: She is alert and oriented to person, place, and time. Mental status is at baseline.  Psychiatric:        Mood and Affect: Mood normal.        Behavior: Behavior normal.      No results found for any visits on 05/18/24.  Assessment & Plan    Type 2 diabetes mellitus with stage 2 chronic kidney disease, without long-term current use of insulin (HCC) -     Comprehensive metabolic panel with GFR -     Lipid Panel With LDL/HDL Ratio -     Hepatitis C antibody -     Pneumococcal conjugate vaccine 20-valent -     Microalbumin / creatinine urine ratio  Primary osteoarthritis of left hand  Hypothyroidism, unspecified type -     CBC with Differential/Platelet -     TSH  Perimenopause  Hair thinning  Hyperlipidemia associated with type 2 diabetes  mellitus (HCC) -     Comprehensive metabolic panel with GFR -     Hemoglobin A1c -     Lipid Panel With LDL/HDL Ratio  Elevated alkaline phosphatase level -     Comprehensive metabolic panel with GFR  Need for pneumococcal vaccination -     Pneumococcal conjugate vaccine 20-valent  Encounter for screening for HIV -     HIV Antibody (routine testing w rflx)  Need for hepatitis C screening test -     Hepatitis C antibody     Type 2 diabetes mellitus with stage 2 chronic kidney disease, without long-term current use of insulin Blood sugars well-controlled. No hypoglycemia symptoms. Insurance issues with test strips. - Switch to Relion test strips from [employer] as a more affordable option. - Continue current diabetes management plan with Ozempic  1 mg weekly and metformin  XR 500 mg daily.  Hypothyroidism, unspecified  type Well-managed on levothyroxine  50 mcg daily. Increased irritability noted, possibly menopause-related.  Primary osteoarthritis of left hand Pain in knuckles managed with meloxicam . - Continue meloxicam  15 mg as needed. - Consider topical treatments such as Voltaren gel, Aspercreme, or capsaicin cream.  Perimenopause; hair thinning Hair thinning possibly related to menopause. No recent menstrual periods due to IUD. Hormone testing discussed as an option for assessment of menopausal status but not pursued per patient preference. - Monitor symptoms and consider lifestyle modifications such as trying different shampoos.  Hyperlipidemia associated with type 2 diabetes mellitus Chronic, stable on rosuvastatin  40 mg daily.  Recheck lipid panel today.  No changes today.  Elevated alkaline phosphatase level Elevated alkaline phosphatase with normal GGT, PTH and PTH RP.  Vitamin D  normalized and alkaline phosphatase remains mildly elevated.  Previously recommended possible DEXA scan to evaluate for early osteoporosis.  However, insurance will not cover the cost, so patient chooses to defer at this time.  Will recheck alkaline phosphatase with CMP today.  General Health Maintenance Discussed hepatitis B vaccination status; patient declined today.  Patient due for pneumonia vaccine.  Patient does not have known history for screening of HIV and hepatitis C.  Screening recommended for all adults at least once in their lifetime regardless of risk factors.  Patient agreeable to screen for these today. - Administer Prevnar 20 today. - Screen for hepatitis C and HIV in otherwise low risk individual.    Return in about 6 months (around 11/15/2024) for CPE, Chronic f/u w/next provider.      I discussed the assessment and treatment plan with the patient  The patient was provided an opportunity to ask questions and all were answered. The patient agreed with the plan and demonstrated an understanding of the  instructions.   The patient was advised to call back or seek an in-person evaluation if the symptoms worsen or if the condition fails to improve as anticipated.    LAURAINE LOISE BUOY, DO  [facility] [phone] (phone) [phone] (fax)  [facility]     [1]  Outpatient Medications Prior to Visit  Medication Sig   aspirin  EC 81 MG tablet Take 81 mg by mouth daily.    Blood Glucose Monitoring Suppl DEVI 1 each by Does not apply route daily before breakfast. May substitute to any manufacturer covered by patient's insurance.   Glucose Blood (BLOOD GLUCOSE TEST STRIPS) STRP 1 each by In Vitro route daily before breakfast. May substitute to any manufacturer covered by patient's insurance.   Lancet Device MISC 1 each by Does not  apply route daily before breakfast. May substitute to any manufacturer covered by patient's insurance.   Lancets (ONETOUCH DELICA PLUS LANCET33G) MISC SMARTSIG:1 Each Topical Every Morning   levonorgestrel (LILETTA) 19.5 MCG/DAY IUD IUD 1 Intra Uterine Device by Intrauterine route once.   levothyroxine  (SYNTHROID ) 50 MCG tablet TAKE 1 TAB ONCE DAILY ON EMPTY STOMACH WITH A GLASS OF WATER AT LEAST 30-60 MINUTES BEFORE BREAKFAST   meloxicam  (MOBIC ) 15 MG tablet TAKE 1 TABLET (15 MG TOTAL) BY MOUTH DAILY.   metFORMIN  (GLUCOPHAGE -XR) 500 MG 24 hr tablet Take 1 tablet (500 mg total) by mouth daily with supper.   ondansetron  (ZOFRAN -ODT) 4 MG disintegrating tablet Take 1 tablet (4 mg total) by mouth every 8 (eight) hours as needed for nausea or vomiting.   rosuvastatin  (CRESTOR ) 40 MG tablet Take 1 tablet (40 mg total) by mouth daily.   Semaglutide , 1 MG/DOSE, 4 MG/3ML SOPN Inject 1 mg into the skin once a week.   No facility-administered medications prior to visit.   "

## 2024-05-19 LAB — COMPREHENSIVE METABOLIC PANEL WITH GFR
ALT: 25 IU/L (ref 0–32)
AST: 29 IU/L (ref 0–40)
Albumin: 4.2 g/dL (ref 3.8–4.9)
Alkaline Phosphatase: 147 IU/L — ABNORMAL HIGH (ref 49–135)
BUN/Creatinine Ratio: 18 (ref 9–23)
BUN: 16 mg/dL (ref 6–24)
Bilirubin Total: 0.5 mg/dL (ref 0.0–1.2)
CO2: 23 mmol/L (ref 20–29)
Calcium: 9.9 mg/dL (ref 8.7–10.2)
Chloride: 103 mmol/L (ref 96–106)
Creatinine, Ser: 0.88 mg/dL (ref 0.57–1.00)
Globulin, Total: 2.9 g/dL (ref 1.5–4.5)
Glucose: 100 mg/dL — ABNORMAL HIGH (ref 70–99)
Potassium: 4.5 mmol/L (ref 3.5–5.2)
Sodium: 140 mmol/L (ref 134–144)
Total Protein: 7.1 g/dL (ref 6.0–8.5)
eGFR: 77 mL/min/1.73

## 2024-05-19 LAB — CBC WITH DIFFERENTIAL/PLATELET
Basophils Absolute: 0.1 x10E3/uL (ref 0.0–0.2)
Basos: 1 %
EOS (ABSOLUTE): 0.1 x10E3/uL (ref 0.0–0.4)
Eos: 2 %
Hematocrit: 45.8 % (ref 34.0–46.6)
Hemoglobin: 14.8 g/dL (ref 11.1–15.9)
Immature Grans (Abs): 0 x10E3/uL (ref 0.0–0.1)
Immature Granulocytes: 0 %
Lymphocytes Absolute: 1.8 x10E3/uL (ref 0.7–3.1)
Lymphs: 26 %
MCH: 29.1 pg (ref 26.6–33.0)
MCHC: 32.3 g/dL (ref 31.5–35.7)
MCV: 90 fL (ref 79–97)
Monocytes Absolute: 0.5 x10E3/uL (ref 0.1–0.9)
Monocytes: 8 %
Neutrophils Absolute: 4.5 x10E3/uL (ref 1.4–7.0)
Neutrophils: 63 %
Platelets: 261 x10E3/uL (ref 150–450)
RBC: 5.08 x10E6/uL (ref 3.77–5.28)
RDW: 12.7 % (ref 11.7–15.4)
WBC: 7 x10E3/uL (ref 3.4–10.8)

## 2024-05-19 LAB — LIPID PANEL WITH LDL/HDL RATIO
Cholesterol, Total: 171 mg/dL (ref 100–199)
HDL: 63 mg/dL
LDL Chol Calc (NIH): 89 mg/dL (ref 0–99)
LDL/HDL Ratio: 1.4 ratio (ref 0.0–3.2)
Triglycerides: 109 mg/dL (ref 0–149)
VLDL Cholesterol Cal: 19 mg/dL (ref 5–40)

## 2024-05-19 LAB — MICROALBUMIN / CREATININE URINE RATIO
Creatinine, Urine: 106.3 mg/dL
Microalb/Creat Ratio: 10 mg/g{creat} (ref 0–29)
Microalbumin, Urine: 10.7 ug/mL

## 2024-05-19 LAB — TSH: TSH: 2.02 u[IU]/mL (ref 0.450–4.500)

## 2024-05-19 LAB — HEMOGLOBIN A1C
Est. average glucose Bld gHb Est-mCnc: 126 mg/dL
Hgb A1c MFr Bld: 6 % — ABNORMAL HIGH (ref 4.8–5.6)

## 2024-05-19 LAB — HEPATITIS C ANTIBODY: Hep C Virus Ab: NONREACTIVE

## 2024-05-19 LAB — HIV ANTIBODY (ROUTINE TESTING W REFLEX): HIV Screen 4th Generation wRfx: NONREACTIVE

## 2024-05-24 ENCOUNTER — Other Ambulatory Visit: Payer: Self-pay | Admitting: Family Medicine

## 2024-05-24 DIAGNOSIS — E1122 Type 2 diabetes mellitus with diabetic chronic kidney disease: Secondary | ICD-10-CM

## 2024-05-27 ENCOUNTER — Other Ambulatory Visit: Payer: Self-pay | Admitting: Family Medicine

## 2024-06-06 ENCOUNTER — Ambulatory Visit: Payer: Self-pay | Admitting: Family Medicine

## 2024-11-15 ENCOUNTER — Encounter: Admitting: Family Medicine
